# Patient Record
Sex: Female | Born: 1975
Health system: Southern US, Community
[De-identification: ages and names within clinical notes are randomized; demographics above are authoritative.]

## PROBLEM LIST (undated history)

## (undated) DIAGNOSIS — E559 Vitamin D deficiency, unspecified: Secondary | ICD-10-CM

## (undated) DIAGNOSIS — B019 Varicella without complication: Secondary | ICD-10-CM

## (undated) DIAGNOSIS — R011 Cardiac murmur, unspecified: Secondary | ICD-10-CM

## (undated) DIAGNOSIS — E079 Disorder of thyroid, unspecified: Secondary | ICD-10-CM

## (undated) DIAGNOSIS — I10 Essential (primary) hypertension: Secondary | ICD-10-CM

## (undated) DIAGNOSIS — Q249 Congenital malformation of heart, unspecified: Secondary | ICD-10-CM

## (undated) DIAGNOSIS — N941 Unspecified dyspareunia: Secondary | ICD-10-CM

## (undated) HISTORY — DX: Essential (primary) hypertension: I10

## (undated) HISTORY — DX: Cardiac murmur, unspecified: R01.1

## (undated) HISTORY — DX: Vitamin D deficiency, unspecified: E55.9

## (undated) HISTORY — DX: Varicella without complication: B01.9

## (undated) HISTORY — DX: Unspecified dyspareunia: N94.10

## (undated) HISTORY — DX: Disorder of thyroid, unspecified: E07.9

## (undated) HISTORY — DX: Congenital malformation of heart, unspecified: Q24.9

---

## 2014-06-06 HISTORY — PX: POLYPECTOMY: SHX149

## 2014-06-09 ENCOUNTER — Ambulatory Visit: Payer: Self-pay | Admitting: Obstetrics and Gynecology

## 2014-06-09 DIAGNOSIS — I059 Rheumatic mitral valve disease, unspecified: Secondary | ICD-10-CM

## 2014-06-09 LAB — HEMOGLOBIN: HGB: 13.4 g/dL (ref 12.0–16.0)

## 2014-06-14 ENCOUNTER — Ambulatory Visit: Payer: Self-pay | Admitting: Obstetrics and Gynecology

## 2014-11-27 NOTE — Op Note (Signed)
PATIENT NAME:  Judith Oneal, Judith Oneal MR#:  [id number] DATE OF BIRTH:  March 17, 1976  DATE OF PROCEDURE:  06/14/2014  PREOPERATIVE DIAGNOSES: 1.  Abnormal uterine bleeding.  2.  Endometrial polyp.   POSTOPERATIVE DIAGNOSES: 1.  Abnormal uterine bleeding.  2.  Endometrial polyp.   OPERATION:  Hysteroscopy, dilation and curettage, polypectomy with MyoSure device and Mirena intrauterine device insertion.   ANESTHESIA:  General.   SURGEON:  Cornelia Walraven S. Marcelline Mates, MD   ESTIMATED BLOOD LOSS:  Minimal.   OPERATIVE FLUIDS:  1000 mL.   URINE OUTPUT:  100 mL.   FLUID DEFICIT:  10 mL.   COMPLICATIONS:  None.   FINDINGS:  Proliferative endometrium. Endometrial polyp was noted at the fundus.   SPECIMEN:  Endometrial curettings and endometrial polyp.   CONDITION:  Stable.   DESCRIPTION OF PROCEDURE:  The patient was taken to the operating room, where she was placed under general anesthesia without difficulty. She was then prepped and draped in normal sterile fashion and positioned in the dorsal lithotomy position. Next, a straight catheterization was performed with return of 100 mL of clear urine. A sterile speculum was then placed in the patient's vagina, and the anterior lip of the cervix was grasped using a single-tooth tenaculum. The uterus was then sounded to approximately 10 cm and dilated appropriately. The hysteroscope was then advanced into the uterine cavity with the above findings noted. The MyoSure device was then inserted into the hysteroscope, and the MyoSure device was activated to perform the polypectomy. As the polypectomy was performed, the MyoSure device was then removed from the hysteroscope device. The hysteroscope was then removed from the patient's uterine cavity. A sharp curettage was then performed until a gritty texture was noted. The hysteroscope was then advanced again to the patient's uterine cavity. The endometrial polyp was noted to be gone at this time. The hysteroscope was again  removed from the uterine cavity, and the Mirena IUD was then inserted without difficulty. After the Mirena IUD was inserted, the single-tooth tenaculum was removed from the anterior lip of the patient's cervix. Good hemostasis was noted. All instruments were removed from the patient's vagina. The patient was awakened and taken to the PACU in stable condition. The Mirena IUD lot# is [id number] with an expiration date of 11/2016. All sponge and instrument counts were correct x 2 at the end of the procedure.     ____________________________ Chesley Noon. Marcelline Mates, MD asc:nb D: 06/14/2014 21:38:17 ET T: 06/14/2014 22:09:20 ET JOB#: [id number]  cc: Chesley Noon. Marcelline Mates, MD, <Dictator> Augusto Gamble MD ELECTRONICALLY SIGNED 06/28/2014 9:54

## 2014-11-29 LAB — SURGICAL PATHOLOGY

## 2015-03-29 ENCOUNTER — Encounter (INDEPENDENT_AMBULATORY_CARE_PROVIDER_SITE_OTHER): Payer: Self-pay

## 2015-03-29 ENCOUNTER — Other Ambulatory Visit: Payer: Self-pay | Admitting: Primary Care

## 2015-03-29 ENCOUNTER — Encounter: Payer: Self-pay | Admitting: Primary Care

## 2015-03-29 ENCOUNTER — Ambulatory Visit (INDEPENDENT_AMBULATORY_CARE_PROVIDER_SITE_OTHER): Payer: 59 | Admitting: Primary Care

## 2015-03-29 VITALS — BP 116/66 | HR 62 | Temp 97.9°F | Ht 66.0 in | Wt 195.4 lb

## 2015-03-29 DIAGNOSIS — E039 Hypothyroidism, unspecified: Secondary | ICD-10-CM

## 2015-03-29 LAB — TSH: TSH: 1.5 u[IU]/mL (ref 0.35–4.50)

## 2015-03-29 MED ORDER — LEVOTHYROXINE SODIUM 112 MCG PO TABS: 112.0000 ug | ORAL_TABLET | Freq: Every day | ORAL | Status: DC

## 2015-03-29 NOTE — Assessment & Plan Note (Signed)
Diagnosed 7 years ago. Currently managed on levothyroxine 112 mcg. Last TSH was February 2016 per patient. Will check TSH today and provide refills at current dose if stable.

## 2015-03-29 NOTE — Progress Notes (Signed)
Subjective:    Patient ID: Judith Oneal, female    DOB: [birth year], 39 y.o.   MRN: [id number]  HPI  Judith Oneal is a 39 year old female who presents today to establish care and discuss the problems mentioned below. Will obtain old records. Last physical was one year ago.  1) Hypothyroidism: Diagnosed 7 years ago. She is managed on levothyroxine 112 mcg and has been on this dose for one year. Denies hair loss, weight gain, fatigue. Last TSH was in February 2016 and stable per patient.  2) Heart murmur: Diagnosed at age 62. She underwent an echocardiogram at age 13 and followed up frequently. Last check up was 7 years ago after diagnosis of hypothyroidism. She endorses her last echo to have improved with treatment for underactive thyroid. Denies chest pain, SOB.  Review of Systems  Constitutional: Negative for unexpected weight change.  HENT: Negative for rhinorrhea.   Respiratory: Negative for cough and shortness of breath.   Cardiovascular: Negative for chest pain.  Gastrointestinal: Negative for diarrhea and constipation.  Genitourinary: Negative for difficulty urinating.       Has Mirena, light periods  Musculoskeletal: Negative for myalgias and arthralgias.       Has had cortisone injection to left elbow.  Skin: Negative for rash.  Neurological: Negative for dizziness, numbness and headaches.  Psychiatric/Behavioral:       Denies concerns for anxiety or depression.       Past Medical History  Diagnosis Date  . Chicken pox   . Cardiac arrhythmia due to congenital heart disease   . Heart murmur   . Hypertension   . Thyroid disease     Social History   Social History  . Marital Status: Single    Spouse Name: N/A  . Number of Children: N/A  . Years of Education: N/A   Occupational History  . Not on file.   Social History Main Topics  . Smoking status: Never Smoker   . Smokeless tobacco: Not on file  . Alcohol Use: 0.0 oz/week    0 Standard drinks or equivalent per  week     Comment: spcial  . Drug Use: Not on file  . Sexual Activity: Not on file   Other Topics Concern  . Not on file   Social History Narrative   Single.   No children.   Works at [employer] as a Freight forwarder.   [place] lake, reading.    Past Surgical History  Procedure Laterality Date  . Polypectomy      polyp removal from uterus and mirena inserted    Family History  Problem Relation Age of Onset  . Arthritis Mother   . Prostate cancer Father   . Arthritis Maternal Grandfather   . Hyperlipidemia Maternal Grandfather   . Hypertension Maternal Grandfather   . Kidney cancer Maternal Grandfather     No Known Allergies  No current outpatient prescriptions on file prior to visit.   No current facility-administered medications on file prior to visit.    BP 116/66 mmHg  Pulse 62  Temp(Src) 97.9 F (36.6 C) (Oral)  Ht 5' 6"  (1.676 m)  Wt 195 lb 6.4 oz (88.633 kg)  BMI 31.55 kg/m2  SpO2 98%  LMP 03/15/2015    Objective:   Physical Exam  Constitutional: She appears well-nourished.  Cardiovascular: Normal rate and regular rhythm.   Pulmonary/Chest: Effort normal and breath sounds normal.  Musculoskeletal: Normal range of motion. She exhibits no edema  or tenderness.  Skin: Skin is warm and dry.  Psychiatric: She has a normal mood and affect.          Assessment & Plan:

## 2015-03-29 NOTE — Progress Notes (Signed)
Pre visit review using our clinic review tool, if applicable. No additional management support is needed unless otherwise documented below in the visit note. 

## 2015-03-29 NOTE — Patient Instructions (Signed)
Complete lab work prior to leaving today. I will notify you of your results.  Please schedule a physical with me in the next 3 months. You will also schedule a lab only appointment one week prior. We will discuss your lab results during your physical.  It was a pleasure to meet you today! Please don't hesitate to call me with any questions. Welcome to [facility]!

## 2015-04-05 ENCOUNTER — Encounter: Payer: Self-pay | Admitting: Primary Care

## 2015-06-21 ENCOUNTER — Other Ambulatory Visit: Payer: Self-pay | Admitting: Internal Medicine

## 2015-06-21 DIAGNOSIS — Z0000 Encounter for general adult medical examination without abnormal findings: Secondary | ICD-10-CM

## 2015-06-28 ENCOUNTER — Other Ambulatory Visit (INDEPENDENT_AMBULATORY_CARE_PROVIDER_SITE_OTHER): Payer: 59

## 2015-06-28 DIAGNOSIS — Z0000 Encounter for general adult medical examination without abnormal findings: Secondary | ICD-10-CM | POA: Diagnosis not present

## 2015-06-28 LAB — TSH: TSH: 0.87 u[IU]/mL (ref 0.35–4.50)

## 2015-06-28 LAB — CBC
HCT: 42.1 % (ref 36.0–46.0)
HEMOGLOBIN: 13.8 g/dL (ref 12.0–15.0)
MCHC: 32.7 g/dL (ref 30.0–36.0)
MCV: 86.1 fl (ref 78.0–100.0)
PLATELETS: 193 10*3/uL (ref 150.0–400.0)
RBC: 4.89 Mil/uL (ref 3.87–5.11)
RDW: 13.5 % (ref 11.5–15.5)
WBC: 5.1 10*3/uL (ref 4.0–10.5)

## 2015-06-28 LAB — COMPREHENSIVE METABOLIC PANEL
ALT: 15 U/L (ref 0–35)
AST: 16 U/L (ref 0–37)
Albumin: 3.9 g/dL (ref 3.5–5.2)
Alkaline Phosphatase: 72 U/L (ref 39–117)
BUN: 11 mg/dL (ref 6–23)
CALCIUM: 9.2 mg/dL (ref 8.4–10.5)
CHLORIDE: 106 meq/L (ref 96–112)
CO2: 27 meq/L (ref 19–32)
Creatinine, Ser: 0.92 mg/dL (ref 0.40–1.20)
GFR: 71.41 mL/min (ref >60.00)
GLUCOSE: 98 mg/dL (ref 70–99)
POTASSIUM: 4.4 meq/L (ref 3.5–5.1)
Sodium: 139 mEq/L (ref 135–145)
Total Bilirubin: 0.3 mg/dL (ref 0.2–1.2)
Total Protein: 7 g/dL (ref 6.0–8.3)

## 2015-06-28 LAB — LIPID PANEL
CHOLESTEROL: 211 mg/dL — AB (ref 0–200)
HDL: 44.4 mg/dL (ref >39.00)
LDL Cholesterol: 146 mg/dL — ABNORMAL HIGH (ref 0–99)
NonHDL: 166.57
TRIGLYCERIDES: 105 mg/dL (ref 0.0–149.0)
Total CHOL/HDL Ratio: 5
VLDL: 21 mg/dL (ref 0.0–40.0)

## 2015-06-28 LAB — T4, FREE: FREE T4: 1.08 ng/dL (ref 0.60–1.60)

## 2015-07-05 ENCOUNTER — Encounter: Payer: Self-pay | Admitting: Primary Care

## 2015-07-05 ENCOUNTER — Ambulatory Visit (INDEPENDENT_AMBULATORY_CARE_PROVIDER_SITE_OTHER): Payer: 59 | Admitting: Primary Care

## 2015-07-05 VITALS — BP 114/76 | HR 63 | Temp 98.0°F | Ht 66.0 in | Wt 191.4 lb

## 2015-07-05 DIAGNOSIS — E785 Hyperlipidemia, unspecified: Secondary | ICD-10-CM

## 2015-07-05 DIAGNOSIS — E039 Hypothyroidism, unspecified: Secondary | ICD-10-CM

## 2015-07-05 DIAGNOSIS — Z0000 Encounter for general adult medical examination without abnormal findings: Secondary | ICD-10-CM | POA: Diagnosis not present

## 2015-07-05 DIAGNOSIS — Z0001 Encounter for general adult medical examination with abnormal findings: Secondary | ICD-10-CM | POA: Insufficient documentation

## 2015-07-05 NOTE — Assessment & Plan Note (Signed)
TC of 211, LDL of 146. Discussed importance of healthy diet and regular exercise. Will continue to monitor. Recheck in 1 year.

## 2015-07-05 NOTE — Assessment & Plan Note (Signed)
Tdap and pap UTD. Declines flu. She is to schedule mammogram. Exam unremarkable. Labs with hyperlipidemia. Discussed importance of healthy diet and regular exercise. Follow up in 1 year for repeat physical.

## 2015-07-05 NOTE — Patient Instructions (Signed)
Work to increase consumption of vegetables, lean meats, and water. Reduce pastas, breads, rice, sweets.  Start exercising. You should be getting 1 hour of moderate intensity exercise 5 days weekly.  Schedule your mammogram as discussed.  Follow up in 1 year for repeat physical or sooner if needed.  It was a pleasure to see you today!

## 2015-07-05 NOTE — Progress Notes (Signed)
Pre visit review using our clinic review tool, if applicable. No additional management support is needed unless otherwise documented below in the visit note. 

## 2015-07-05 NOTE — Progress Notes (Signed)
Subjective:    Patient ID: Judith Oneal, female    DOB: 03-Nov-1975, 39 y.o.   MRN: [id number]  HPI  Judith Oneal is a 39 year old female who presents today for complete physical.  Immunizations: -Tetanus: Completed in 08/2008 -Influenza: Declines today.   Diet: Judith Oneal endorses a fair diet. Breakfast: Bagel or cereal Lunch: Protein drink, fruit, nuts. Dinner: Dance movement psychotherapist, pasta, vegetables, occasional bread Snacks: None Desserts: Twice weekly Beverages: Water, black coffee Exercise: Judith Oneal does not currently exercise. Eye exam: Completed 1 year ago. Dental exam: Completed 4-5 years ago Pap Smear: Completed in 2015 with Dr. Marcelline Mates. Normal. Mammogram: Completed in 2013.    Review of Systems  Constitutional: Negative for unexpected weight change.  HENT: Negative for rhinorrhea.   Respiratory: Negative for cough and shortness of breath.   Cardiovascular: Negative for chest pain.  Gastrointestinal: Negative for diarrhea and constipation.  Genitourinary: Negative for difficulty urinating.       Mirena in place.   Musculoskeletal: Negative for myalgias and arthralgias.  Skin: Negative for rash.  Neurological: Negative for dizziness, numbness and headaches.       Occasional numbess from tendonitis  Psychiatric/Behavioral:       Denies concerns for anxiety or depression       Past Medical History  Diagnosis Date  . Chicken pox   . Cardiac arrhythmia due to congenital heart disease   . Heart murmur   . Hypertension   . Thyroid disease   . Vitamin D deficiency     Social History   Social History  . Marital Status: Single    Spouse Name: N/A  . Number of Children: N/A  . Years of Education: N/A   Occupational History  . Not on file.   Social History Main Topics  . Smoking status: Never Smoker   . Smokeless tobacco: Not on file  . Alcohol Use: 0.0 oz/week    0 Standard drinks or equivalent per week     Comment: spcial  . Drug Use: Not on file  . Sexual Activity: Not  on file   Other Topics Concern  . Not on file   Social History Narrative   Single.   No children.   Works at [employer] as a Freight forwarder.   Enjoys going to the lake, reading.    Past Surgical History  Procedure Laterality Date  . Polypectomy      polyp removal from uterus and mirena inserted    Family History  Problem Relation Age of Onset  . Arthritis Mother   . Prostate cancer Father   . Arthritis Maternal Grandfather   . Hyperlipidemia Maternal Grandfather   . Hypertension Maternal Grandfather   . Kidney cancer Maternal Grandfather     No Known Allergies  Current Outpatient Prescriptions on File Prior to Visit  Medication Sig Dispense Refill  . Cholecalciferol (VITAMIN D3) 2000 UNITS TABS Take 2 tablets by mouth daily.    Marland Kitchen levothyroxine (SYNTHROID, LEVOTHROID) 112 MCG tablet Take 1 tablet (112 mcg total) by mouth daily before breakfast. 30 tablet 11  . Multiple Vitamin (MULTI VITAMIN DAILY PO) Take 1 tablet by mouth daily.    . vitamin C (ASCORBIC ACID) 500 MG tablet Take 500 mg by mouth daily.    . vitamin E 400 UNIT capsule Take 400 Units by mouth daily.     No current facility-administered medications on file prior to visit.    BP 114/76 mmHg  Pulse 63  Temp(Src) 98 F (  36.7 C) (Oral)  Ht 5' 6"  (1.676 m)  Wt 191 lb 6.4 oz (86.818 kg)  BMI 30.91 kg/m2  SpO2 99%  LMP 06/20/2015    Objective:   Physical Exam  Constitutional: Judith Oneal is oriented to person, place, and time. Judith Oneal appears well-nourished.  HENT:  Right Ear: Tympanic membrane and ear canal normal.  Left Ear: Tympanic membrane and ear canal normal.  Nose: Nose normal.  Mouth/Throat: Oropharynx is clear and moist.  Eyes: Conjunctivae and EOM are normal. Pupils are equal, round, and reactive to light.  Neck: Neck supple. No thyromegaly present.  Cardiovascular: Normal rate and regular rhythm.   Pulmonary/Chest: Effort normal and breath sounds normal.  Abdominal: Soft. Bowel sounds are normal.  There is no tenderness.  Musculoskeletal: Normal range of motion.  Lymphadenopathy:    Judith Oneal has no cervical adenopathy.  Neurological: Judith Oneal is alert and oriented to person, place, and time. Judith Oneal has normal reflexes. No cranial nerve deficit.  Skin: Skin is warm and dry.  Psychiatric: Judith Oneal has a normal mood and affect.          Assessment & Plan:

## 2015-07-05 NOTE — Assessment & Plan Note (Signed)
TSH and Free T4 stable on Levothyroxine 112 mcg. Continue same dose.

## 2016-01-05 ENCOUNTER — Ambulatory Visit (INDEPENDENT_AMBULATORY_CARE_PROVIDER_SITE_OTHER): Payer: Commercial Managed Care - HMO | Admitting: Obstetrics and Gynecology

## 2016-01-05 ENCOUNTER — Encounter: Payer: Self-pay | Admitting: Obstetrics and Gynecology

## 2016-01-05 VITALS — BP 119/74 | HR 51 | Ht 66.0 in | Wt 188.1 lb

## 2016-01-05 DIAGNOSIS — I341 Nonrheumatic mitral (valve) prolapse: Secondary | ICD-10-CM | POA: Diagnosis not present

## 2016-01-05 DIAGNOSIS — E038 Other specified hypothyroidism: Secondary | ICD-10-CM | POA: Diagnosis not present

## 2016-01-05 DIAGNOSIS — E669 Obesity, unspecified: Secondary | ICD-10-CM

## 2016-01-05 DIAGNOSIS — Z01419 Encounter for gynecological examination (general) (routine) without abnormal findings: Secondary | ICD-10-CM | POA: Diagnosis not present

## 2016-01-05 DIAGNOSIS — Z124 Encounter for screening for malignant neoplasm of cervix: Secondary | ICD-10-CM | POA: Diagnosis not present

## 2016-01-05 DIAGNOSIS — E034 Atrophy of thyroid (acquired): Secondary | ICD-10-CM

## 2016-01-05 NOTE — Patient Instructions (Addendum)
RE: MyChart  Dear Ms. Scharrer  We are excited to introduce MyChart, a new best-in-class service that provides you online access to important information in your electronic medical record. We want to make it easier for you to view your health information - all in one secure location - when and where you need it. We expect MyChart will enhance the quality of care and service we provide. Use the activation code below to enroll in MyChart online at [email or web address]  When you register for MyChart, you can:  Marland Kitchen View your test results. . Communicate securely with your physician's office.  . View your medical history, allergies, medications, and immunizations. . Conveniently print information such as your medication lists.  If you are age 42 or older and want a member of your family to have access to your record, you must provide written consent by completing a proxy form available at our facility. Please speak to our clinical staff about guidelines regarding accounts for patients younger than age 42.  As you activate your MyChart account and need any technical assistance, please call the MyChart technical support line at [phone]) or email your question to [email or web address]. If you email your question(s), please include your name, a return phone number and the best time to reach you.  Thank you for using MyChart as your new health and wellness resource!  MyChart Activation Code:  Activation code not generated Current MyChart Status: Active           [facility]  [place], [place] [address] Cuidados preventivos en las mujeres adultas (Preventive Care for Adults, Female) Un estilo de vida saludable y los cuidados preventivos pueden favorecer la salud y [place]. Las pautas de salud preventivas para las mujeres incluyen las siguientes prcticas clave:  Un examen fsico de rutina anual y Optometrist estudios preventivos es un buen  modo de Chief Technology Officer su salud. Retsof de Publishing rights manager preocupaciones y Civil engineer, contracting el estado de su salud, y que le realicen estudios completos.  Consulte al dentista para realizar un examen de rutina y cuidados preventivos cada 6 meses. Cepllese los dientes al [employer] veces por da y psese el hilo dental al menos una vez por da. Una buena higiene bucal evita caries y enfermedades de las encas.  La frecuencia con que debe hacerse exmenes de la vista depende de su edad, su estado de [place], su historia familiar, el uso de lentes de contacto y otros factores. Siga las recomendaciones del mdico para saber con qu frecuencia debe hacerse exmenes de la vista.  Consuma una dieta saludable. Los alimentos que contienen vegetales, las frutas, los cereales [place], los productos lcteos bajos en grasas y las protenas magras contienen nutrientes que son necesarios, sin consumir Nurse, mental health. Disminuya la ingesta de alimentos ricos en grasas slidas, azcares y sal agregadas. Consuma la cantidad de caloras adecuada para usted. Si es necesario, pdale informacin acerca de una dieta [place] a su mdico.  Realizar actividad fsica de forma regular es una de las prcticas ms importantes que puede hacer por su salud. Los adultos deben hacer al menos 150 minutos de ejercicios de intensidad moderada (cualquier actividad que aumente la frecuencia cardaca y lo haga transpirar) cada semana. Adems, la [employer] de los adultos necesita practicar ejercicios de fortalecimiento muscular dos o ms veces por semana.  Mantenga un peso saludable. El ndice de masa corporal [facility]) es una herramienta que identifica posibles problemas con [place]. Proporciona una estimacin de  la grasa corporal basndose en el peso y la altura. El mdico podr determinar su [facility] y ayudarlo a Scientist, forensic o Theatre manager un peso saludable. Para los adultos de 20 aos o ms:  Un [facility] menor de 18,5 se considera bajo peso.  Un [facility] entre 18,5 y  24,9 es normal.  Un [facility] entre 25 y 29,9 se considera sobrepeso.  Un IMC de 30 o ms se considera obesidad.  Realice actividad fsica y evite ingerir grasas saturadas para mantener un nivel normal de lpidos y Research scientist (life sciences). Consuma una dieta equilibrada y saludable, e incluya variedad de frutas y Photographer. A partir de los 42 aos se deben realizar anlisis de sangre a fin de Freight forwarder nivel de lpidos y colesterol en la sangre y [place] cada 5 aos. Si los niveles de lpidos o colesterol son altos, tiene ms de 50 aos o tiene riesgo elevado de sufrir enfermedades cardacas, Designer, industrial/product controlarse con ms frecuencia. Si tiene Coca Cola de lpidos y colesterol, debe recibir tratamiento con medicamentos, si la dieta y el ejercicio no estn funcionando.  Si fuma, consulte con el mdico acerca de las opciones para dejar de [place]. Si no consume tabaco, no comience.  Se recomienda realizar exmenes de deteccin de cncer de pulmn a personas adultas entre 42 y 42 aos que estn en riesgo de Horticulturist, commercial de pulmn por sus antecedentes de consumo de tabaco. Para quienes hayan fumado durante 30 aos un paquete diario, y sigan fumando o hayan dejado el hbito en algn momento en los ltimos 15 aos, se recomienda realizarse una tomografa computarizada de baja dosis de los pulmones todos los aos. Fumar 1paquete-ao equivale a fumar un promedio de 1paquete de cigarrillos diario durante 1ao (por ejemplo: 1paquete por da durante 30 aos o 2paquetes por da durante 15aos). Los exmenes anuales deben continuar hasta que el fumador haya dejado de fumar durante un mnimo de 15 aos. Ya no deben Emergency planning/management officer que tengan un problema de salud que les impida recibir tratamiento para el cncer de pulmn.  Si est embarazada, no beba alcohol. Si est amamantando, beba alcohol con prudencia. Si no est embarazada y decide beber alcohol, no beba ms de Naval architect. Se  considera una medida a 12onzas (337m) de cerveza, 5onzas (1434m de vino, o 1,3,[id number] licor.  Evite el consumo de drogas. No comparta las agujas. Pida ayuda si necesita asistencia o instrucciones con respecto a abandonar el consumo de drogas.  La hipertensin arterial causa enfermedades cardacas y auSerbial riesgo de ictus. Debe controlar su presin arterial al menos cada uno o doMorro BayLa hipertensin arterial que persiste debe tratarse con medicamentos si la prdida de peso y el ejercicio no son efectivos.  Si tiene entre [phone], consulte a su mdico si debe tomar aspirina para prevenir ictus.  Los anC.H. Robinson Worldwidee deteccin de la diabetes se realizan extrayendo una muLuis Lopeze saLattimoreara verificar el nivel sanguneo de glucosa despus de no haber comido durante determinado perodo (ay[place]an[place] Si usted no tiene sobrepeso ni factores de riesgo de diabetes, deben hacerle estos anlisis una vez cada 3 aos a partir de los 45102os. Si usted tiene sobrepeso u obesidad y su edad es de 40 a 7032os, deben hacerle anlisis de deProgramme researcher, broadcasting/film/videoe la diabetes todos los aos como parte de la evaluacin del riesgo cardiovascular.  Las evaluaciones para dePublic affairs consultante mama son un mtodo preventivo fundamental para las mujeres. Debe practicar  la "autoconciencia de las mamas". Esto significa que Chief Technology Officer apariencia normal de sus mamas y cmo se sienten, y Electrical engineer un autoexamen de Glass blower/designer. Si detecta algn cambio, no importa cun pequeo sea, debe informarlo a su mdico. Las ConAgra Foods 20 y 6 aos deben hacerse un examen clnico de las mamas como parte del examen regular de [place], cada uno a tres aos. Despus de los [address] deben Coca[employer]a. A partir de los [address], deben hacerse una mamografa (radiografa de mamas) cada ao. Las mujeres con antecedentes familiares de cncer de mama deben hablar con el mdico para someterse a un estudio gentico. Las que tienen ms riesgo  deben hacerse una resonancia magntica y Lavinia Sharps todos [place].  La evaluacin del riesgo de cncer relacionado con el gen del cncer de mama (BRCA) se recomienda a mujeres que tengan familiares con cncer relacionado con el BRCA. Los cnceres relacionados con el BRCA incluyen el cncer de mama, de ovario, de trompas y peritoneal. Raynelle Jan familiares con estos tumores malignos puede estar asociado con un mayor riesgo de cambios dainos (mutaciones) en los genes del cncer de mama BRCA1 y BRCA2. Los resultados de la evaluacin determinarn la necesidad de asesoramiento gentico y de [place] de BRCA1 y BRCA2.  El mdico puede recomendarle que se haga pruebas peridicas de deteccin de cncer de los rganos de la pelvis (ovarios, tero y vagina). Estas pruebas incluyen un examen plvico, que abarca controlar si se produjeron cambios microscpicos en la superficie del cuello del tero (prueba de Papanicolaou). Pueden recomendarle que se haga estas pruebas cada 3aos, a partir de los 21aos.  A las mujeres que tienen entre 30 y 86aos, los mdicos pueden recomendarles que se sometan a exmenes plvicos y pruebas de Papanicolaou cada 65aos, o a la prueba de Papanicolaou y el examen plvico en combinacin con estudios de deteccin del virus del papiloma humano (VPH) cada 5aos. Algunos tipos de VPH aumentan el riesgo de Chief Financial Officer de cuello del tero. La prueba para la deteccin del VPH tambin puede realizarse a mujeres de cualquier edad cuyos resultados de la prueba de Papanicolaou no sean claros.  Es posible que otros mdicos no recomienden exmenes de deteccin a mujeres no embarazadas que se consideran sujetos de bajo riesgo de Chief Financial Officer de pelvis y que no tienen sntomas. Pregntele al mdico si un examen plvico de deteccin es adecuado para usted.  Si ha recibido un tratamiento para Science writer cervical o una enfermedad que podra causar cncer, necesitar realizarse una prueba de  Papanicolaou y controles durante al menos 95 aos de concluido el [place]. Si no se ha hecho el Papanicolaou con regularidad, debern volver a evaluarse los factores de riesgo (como tener un nuevo compaero sexual), para Teacher, adult education si debe realizarse los estudios nuevamente. Algunas mujeres sufren problemas mdicos que aumentan la probabilidad de Museum/gallery curator cncer de cuello del tero. En estos casos, el mdico podr [employer] se realicen controles y pruebas de Papanicolaou con ms frecuencia.  El cncer colorrectal puede detectarse y con frecuencia puede prevenirse. La mayor parte de los estudios de rutina se deben Medical laboratory scientific officer a Field seismologist a Proofreader de los 72 aos y [place] 50 aos. Sin embargo, el mdico podr aconsejarle que lo haga antes, si tiene factores de riesgo para el cncer de colon. Una vez por ao, el mdico le dar un kit de prueba para Hydrologist en la materia fecal. La utilizacin de un tubo con John Giovanni cmara  en su extremo para examinar directamente el colon (sigmoidoscopia o colonoscopia), puede detectar formas tempranas de cncer colorrectal. Hable con su mdico si tiene 36 aos, edad en la que debe comenzar a Optometrist los estudios de [place]. El examen directo del colon debe repetirse cada 5 a 10aos, hasta los 91aos, excepto que se encuentren formas tempranas de plipos precancerosos o pequeos bultos.  Las personas con un riesgo mayor de Insurance risk surveyor hepatitis B deben realizarse anlisis para Futures trader virus. Se considera que tiene un alto riesgo de Museum/gallery curator hepatitis B si:  Naci en un pas donde la hepatitis B es frecuente. Pregntele a su mdico qu pases son considerados de Public affairs consultant.  Sus padres nacieron en un pas de alto riesgo y usted no recibi una vacuna que lo proteja contra la hepatitisB (vacuna contra la hepatitisB).  [place].  [place] agujas para inyectarse drogas.  Vive o tiene sexo con alguien que tiene hepatitis B.  Recibe tratamiento de  hemodilisis.  Toma ciertos medicamentos para Nurse, mental health, trasplante de rganos y afecciones autoinmunes.  Se recomienda realizar un anlisis de sangre para Hydrographic surveyor hepatitis C a todas las personas nacidas entre 1945 y 1965, y a todo aquel que sepa que tiene riesgo de haber contrado esta enfermedad.  Practique el sexo seguro. Use condones y evite las prcticas sexuales riesgosas para disminuir el contagio de enfermedades de transmisin sexual (ETS). Algunas ETS son la gonorrea, clamidia, sfilis, tricomoniasis, herpes, VPH y el virus de inmunodeficiencia humana (VIH). El herpes, el VIH y [place] VPH son enfermedades virales que no tienen [place]. Pueden producir discapacidad, cncer y [employer].  Debe realizarse pruebas de deteccin de enfermedades de transmisin sexual (ETS), incluidas gonorrea y clamidia si:  Es sexualmente activa y es menor de 24aos.  Es mayor de 24aos, y Investment banker, operational informa que corre riesgo de tener este tipo de [place].  La actividad sexual ha cambiado desde que le hicieron la ltima prueba de deteccin y tiene un riesgo mayor de Best boy clamidia o Radio broadcast assistant. Pregntele al mdico si usted tiene riesgo.  Si tiene riesgo de infectarse por el VIH, se recomienda tomar diariamente un medicamento recetado para evitar la infeccin. Esto se conoce como profilaxis previa a la exposicin. Se considera que est en riesgo si:  Es [place] sexualmente y no [place] preservativos habitualmente o no conoce el estado del VIH de sus Advertising copywriter.  Se inyecta drogas.  Es [place] sexualmente con Ardelia Mems pareja que tiene VIH.  Consulte a su mdico para saber si tiene un alto riesgo de infectarse por el VIH. Si opta por comenzar la profilaxis previa a la exposicin, primero debe realizarse anlisis de deteccin del VIH. Luego, le harn anlisis cada 40mses mientras est tomando los medicamentos para la profilaxis previa a la exposicin.  La osteoporosis es una enfermedad en  la que los huesos pierden los minerales y la fuerza por el avance de la edad. El resultado pueden ser fracturas o quebraduras graves en [place] El riesgo de osteoporosis puede identificarse con uArdelia Memsprueba de densidad sea. Las mujeres de ms de 619aos y las que tengan riesgos de sufrir fracturas u osteoporosis deben discutir las opciones de control con su mdico. Consulte a su mdico si debe tomar un suplemento de calcio o de vitamina D para reducir el riesgo de osteoporosis.  La menopausia est asociada a sntomas y riesgos fsicos. Se dispone de una terapia de reemplazo hormonal para disminuir los sntomas y [place]  Consulte a su mdico para saber si la terapia de reemplazo hormonal es conveniente para usted.  Utilice pantalla solar. Aplique pantalla solar de Kerry Dory y repetida a lo largo del Training and development officer. Resgurdese del sol cuando la sombra sea ms pequea que usted. Protjase usando mangas y pantalones largos, un sombrero de ala ancha y anteojos para el sol todo el ao, siempre que se encuentre al [place].  Una vez por mes hgase un examen de la piel de todo el cuerpo usando un espejo para ver la espalda. Informe al mdico si aparecen nuevos lunares, o si nota que los que ya tena ahora tienen bordes [place], aumentaron su tamao y son ms grandes que una goma de lpiz o si su forma o color cambi.  Mantngase al da con las vacunas obligatorias.  Vacuna antigripal. Todas las personas adultas deben vacunarse cada ao.  Vacuna contra la difteria, el ttanos y Research officer, trade union (Td, Tdap). Las mujeres embarazadas deben recibir una dosis de la vacuna Tdap en cada embarazo. Se debe recibir la dosis independientemente de cunto tiempo haya transcurrido desde la ltima dosis. Es preferible vacunarse entre la semana 58 y 7 de la gestacin. Una persona adulta que no haya recibido la vacuna Tdap anteriormente o que no sabe su estado de vacunacin debe recibir una dosis. Despus de esta  dosis inicial, necesitar aplicarse un refuerzo de la vacuna contra la difteria y el ttanos (Td) cada 10 aos. Las Ship broker que no sepan o no hayan recibido la serie de vacunacin de tres dosis contra la difteria y el ttanos deben iniciar o finalizar una serie de vacunacin primaria, que incluye la dosis contra la difteria, el ttanos y Research officer, trade union (Tdap). Las personas adultas deben recibir una dosis de refuerzo de Td cada 10 aos.  Vacuna contra la varicela. Ardelia Mems persona adulta sin prueba de inmunidad a la varicela debe recibir dos dosis o una segunda dosis si recibi una dosis previamente. Las embarazadas sin prueba de inmunidad deben recibir la primera dosis despus del Media planner. Esta primera dosis se debe aplicar antes del alta del centro de salud. La segunda dosis debe aplicarse entre 4 y 8 semanas posteriores a la primera dosis.  Vacuna contra el virus del Engineer, technical sales (VPH). Las ConAgra Foods 13 y 36 aos que no hayan recibido la vacuna antes deben recibir la serie de 3 dosis. La vacuna no se recomienda en mujeres embarazadas. Sin embargo, no es Chartered loss adjuster una prueba de [place] antes de recibir una dosis. Si se descubre que una mujer est embarazada despus de recibir la dosis, no se Producer, television/film/video. En ese caso, las dosis restantes deben retrasarse hasta despus del embarazo. Se recomienda la vacuna para cualquier persona inmunodeprimida hasta la edad de 26 aos si no recibi [place] o ninguna de las dosis anteriormente. Durante la serie de 3 dosis, la segunda dosis debe Enterprise Products 4 y 8 semanas posteriores a la primera dosis. La tercera dosis debe aplicarse 24 semanas despus de la primera dosis y 16 semanas despus de la segunda dosis.  Vacuna contra el herpes zoster. Se recomienda una dosis en personas [employer] de 75 aos a menos que sufran ciertas enfermedades.  Vacuna contra el sarampin, la rubola y las paperas ([place]). Los adultos nacidos antes  de 1957 generalmente se consideran inmunes al sarampin y las paperas. Las Forensic scientist en [phone] o posteriormente deben recibir una o ms dosis de la vacuna SRP, a menos que [employer] Mutual [employer] Omaha contraindicacin  para la vacuna o que tengan prueba de inmunidad a las tres [place]. Se debe aplicar una segunda dosis de rutina de la vacuna SRP al menos 28das despus de la primera dosis a estudiantes de escuelas terciarias, trabajadores de la salud o viajeros internacionales. Las personas que recibieron la vacuna inactivada contra el sarampin o algn tipo desconocido de vacuna contra el sarampin [place] y 1967 deben recibir dos dosis de la vacuna [place]. Las Advertising copywriter recibieron la vacuna inactivada contra las paperas o algn tipo desconocido de vacuna contra las paperas antes de 1979 y tienen un alto riesgo de infectarse con la enfermedad deben considerar vacunarse con dos dosis de la vacuna SRP. En las mujeres en edad frtil, debe determinarse la inmunidad contra la rubola. Si no hay prueba de inmunidad, las mujeres que no estn embarazadas deben vacunarse. Si no hay prueba de inmunidad, las mujeres que estn embarazadas deben retrasar la vacunacin hasta despus del [place]. Los trabajadores de [employer]	[place] no vacunados que nacieron antes de 1957 y que no tienen prueba de inmunidad contra el sarampin, la rubola y las paperas o no tienen confirmacin de laboratorio de la enfermedad deben considerar vacunarse contra el sarampin y las paperas con dos dosis de la vacuna [place], y contra la rubola con una dosis de la vacuna SRP.  Vacuna antineumoccica conjugada 13 valente (PCV13). Segn indicacin mdica, una persona que no conozca su historia de vacunacin y no tenga registro de vacunas debe recibir la vacuna PCV13. Todos los adultos de 65 aos o ms deben recibir esta vacuna. Una persona de 19 aos o ms que tenga ciertas enfermedades y no se haya vacunado debe recibir una dosis de la vacuna PCV13. Despus  de esta vacuna, se debe aplicar una dosis de la vacuna antineumoccica de polisacridos (PPSV23). Los adultos con alto riesgo de enfermedad neumoccica deben recibir la vacuna PPSV23 al menos 8 semanas despus de la dosis de la vacuna PCV13. Los adultos de ms de 65 aos cuyo sistema inmunitario funcione normalmente deben recibir la dosis de la vacuna PPSV23 al menos 1 ao despus de la dosis de la vacuna PCV13.  Vacuna antineumoccica de polisacridos (PPSV23). Si se indica la vacuna PCV13, primero debe recibir la vacuna PCV13. Todas las personas de 65 aos o mayores deben recibir la vacuna. Una Network engineer de 58 aos que tenga ciertas enfermedades se Teacher, English as a foreign language. Cleora Fleet persona que viva en un hogar de Mining engineer o en un centro de atencin durante mucho tiempo se debe vacunar. Un fumador adulto se Teacher, English as a foreign language. Las personas inmunodeprimidas o con otras enfermedades deben recibir ambas vacunas, PCV13 y PPSV23. Las personas infectadas con el virus de la inmunodeficiencia humana (VIH) deben recibir la vacuna lo antes posible despus del diagnstico. Se debe evitar la vacunacin durante tratamientos de quimioterapia y radioterapia. El uso de rutina de la vacuna PPSV23 no est recomendado para Teacher, early years/pre, personas nativas de [place] o [employer] de 65aos, salvo que tengan ciertas enfermedades que requieran la vacuna. Segn indicacin mdica, las personas que no conozcan su historia de vacunacin y no tengan registros de [place], deben recibir la vacuna PPSV23. Se recomienda una nica revacunacin 5 aos despus de recibir la primera dosis de PPSV23 para personas de 19 a 25 aos con insuficiencia renal crnica, sndrome nefrtico, asplenia o inmunodepresin. Las [place] [employer] recibieron de una a dos dosis de PPSV23 antes de los 65 aos deben recibir otra dosis de [place] a los 83 aos de edad o  posteriormente si pasaron cinco aos, como mnimo, desde la dosis anterior. Las dosis de PPSV23 no son necesarias para  personas que ya recibieron la vacuna a los 80 aos o posteriormente.  Vacuna antimeningoccica. Los adultos con asplenia o con persistentes deficiencias de componentes terminales del complemento deben recibir dos dosis de la vacuna antimeningoccica conjugada tetravalente (MenACWY-D). Las dosis se deben Midwife con un mnimo de 2 meses de diferencia. Deben vacunarse los microbilogos que trabajan con ciertas bacterias meningoccicas, reclutas militares y personas que viajan o viven en pases con una alta tasa de meningitis. Los estudiantes universitarios de Tourist information centre manager la edad de 21 que vivan en una residencia estudiantil deben recibir una dosis si no se aplicaron la vacuna cuando cumplieron o despus de cumplir 16 aos. Las personas que sufren ciertas enfermedades de alto riesgo deben aplicarse una o ms dosis.  Vacuna contra la hepatitis A. Las Advertising copywriter deseen estar protegidas contra esta enfermedad, que sufren ciertas enfermedades de alto riesgo, que trabajan con animales infectados con hepatitis A, que trabajan en los laboratorios de investigacin de hepatitis A, o que viajan o trabajan en pases con una alta tasa de hepatitis A deben recibir la vacuna. Los personas que no fueron vacunadas previamente y [place]o [place] a tener un contacto cercano con una persona adoptada fuera del pas, deben recibir la vacuna durante los primeros [address] despus de su llegada a los [place] desde un pas con una alta tasa de hepatitis A.  Vacuna contra la hepatitis B. Las [place] [employer] deseen estar protegidas contra esta enfermedad, que sufren ciertas enfermedades de alto riesgo, que puedan estar expuestas a sangre u otros fluidos corporales infecciosos, que tienen contactos familiares o parejas sexuales con hepatitis B positivo, que sean clientes o trabajadores de ciertos centros de atencin, o que viajan o trabajan en pases con una alta tasa de hepatitis B deben recibir la vacuna.  Vacuna antihaemophilus  influenzae tipoB ([place]). Una persona no vacunada previamente, que sufra de asplenia o de anemia drepanoctica, o que tenga una esplenectoma programada debe recibir una dosis de la vacuna [place]. Independientemente de la vacunacin previa, un paciente trasplantado con clulas madre hematopoyticas debe recibir Ardelia Mems serie de tres dosis, de 6 a 12 meses despus del trasplante exitoso. La vacuna Hib no est recomendada para personas adultas infectadas con VIH. Controles preventivos - Frecuencia Entre 64 y 52aos  Control de la presin arterial.**/Cada 3 a 5 aos.  Control de lpidos y colesterol.**Carma Lair cinco aos a partir de los 20 aos.  Examen clnico de [employer].**Carma Lair 3 aos en las [employer] 20 y los 28 aos.  Evaluacin del riesgo de cncer relacionado con el BRCA.**/Para mujeres que tienen familiares con cncer relacionado con el BRCA (cncer de mama, de ovario, de trompas y peritoneal).  Prueba de Papanicolaou.**Carma Lair dos [employer] 21 y los 32 aos. Cada tres aos a Proofreader de los 4 aos y [place] 66 o 47 aos, con una historia de tres pruebas de Papanicolaou normales consecutivas.  Pruebas de deteccin de VPH.**/Cada tres aos, a partir de los 43 aos y [place] 55 o 59 aos, con una historia de tres pruebas de Papanicolaou normales consecutivas.  Anlisis de sangre para la hepatitis C.**/Para toda persona con riesgos conocidos de hepatitis C.  Autoexamen de piel [employer].  Vacuna antigripal. [place] los aos.  Vacuna contra la difteria, el ttanos y Research officer, trade union (Tdap, Td).**/Consulte a su mdico. Las mujeres embarazadas deben recibir  una dosis de la vacuna Tdap en cada embarazo. 1dosis de Td cada 10aos.  Vacuna contra la varicela.**/Consulte a su mdico. Las embarazadas sin prueba de inmunidad deben recibir la primera dosis despus del Media planner.  Vacuna contra el VPH. /3 dosis en el curso de 6 meses, si tiene 50 aos o menos. La vacuna no se  recomienda en mujeres embarazadas. Sin embargo, no es Chartered loss adjuster una prueba de [place] antes de recibir una dosis.  Vacuna contra el sarampin, la rubola y las paperas ([place]).Marland KitchenEarleen Newport aplicarse al menos una dosis de SRP si ha nacido en 1957 o despus. Podra tambin necesitar una segunda dosis. En las mujeres en edad frtil, debe determinarse la inmunidad contra la rubola. Si no hay prueba de inmunidad, las mujeres que no estn embarazadas deben vacunarse. Si no hay prueba de inmunidad, las mujeres que estn embarazadas deben retrasar la vacunacin hasta despus del [place].  Vacuna antineumoccica conjugada 13 valente (PCV13).Marland KitchenCecille Amsterdam a su mdico.  Vacuna antineumoccica de polisacridos (PPSV23).**/De una a dos dosis si es fumador o si sufre Actuary.  Vacuna antimeningoccica.**/Si tiene entre 9 y 50 aos y es estudiante universitario de Editor, commissioning que vive en una residencia estudiantil o tiene alguna enfermedad grave, debe recibir Ardelia Mems dosis de esta vacuna. Podra tambin necesitar dosis de refuerzo.  Vacuna contra la hepatitis A.**/Consulte a su mdico.  Vacuna contra la hepatitis B.**/Consulte a su mdico.  Vacuna antihaemophilus influenzae tipoB (Hib).**/Consulte a su mdico. Entre 40 y 39aos  Control de la presin Runner, broadcasting/film/video.  Control de lpidos y colesterol.**Carma Lair cinco aos a partir de los 20 aos.  Pruebas de deteccin de cncer de pulmn. /Todos los aos si tiene entre 59 y 80aos, y si ha fumado durante 30aos un paquete diario y sigue fumando o dej el hbito en algn momento en los ltimos 15aos. Los estudios de Pharmacologist se interrumpen cuando haya dejado de fumar durante al menos 15aos o si tiene un problema de salud que le impida recibir tratamiento para Science writer de pulmn.  Examen clnico de [employer].**/Todos los aos despus de los 40 aos.  Evaluacin del riesgo de cncer relacionado con el BRCA.**/Para mujeres  que tienen familiares con cncer relacionado con el BRCA (cncer de mama, de ovario, de trompas y peritoneal).  Mamografa.**/Una vez por ao a partir de los 62 aos, siempre que tenga buena salud. Consulte a su mdico.  Prueba de Papanicolaou.Marland KitchenCarma Lair tres aos despus de los 65 aos y [place] 21 o 34 aos, con una historia de tres pruebas de Papanicolaou normales consecutivas.  Pruebas de deteccin de VPH.**/Cada tres aos, a partir de los 62 aos y [place] 24 o 24 aos, con una historia de tres pruebas de Papanicolaou normales consecutivas.  Prueba de Personnel officer en las heces [facility]). Carma Lair ao a partir de los 41aos y [employer] 58aos. No tendr que hacerlo si se realiza una colonoscopia cada 10 aos.  Colonoscopia o sigmoidoscopia flexible.Marland KitchenCarma Lair 5 aos para la sigmoidoscopia flexible o cada 10 aos para la colonoscopia, a Proofreader de los 75 aos de edad y [place] 21 aos.  Anlisis de sangre para la hepatitis C.**/Para todas las personas nacidas entre 1945 y 1965, y a todo aquel que tenga un riesgo conocido de haber contrado esta enfermedad.  Autoexamen de piel [employer].  Vacuna antigripal. [place] los aos.  Vacuna contra la difteria, el ttanos y Research officer, trade union (Tdap, Td).**/Consulte a su mdico. Las mujeres embarazadas deben recibir Ardelia Mems  dosis de la vacuna Tdap en cada embarazo. 1dosis de Td cada 10aos.  Vacuna contra la varicela.**/Consulte a su mdico. Las embarazadas sin prueba de inmunidad deben recibir la primera dosis despus del Media planner.  Vacuna contra el herpes zoster.Marland KitchenArdelia Mems dosis para adultos de 60 aos o ms.  Vacuna contra el sarampin, la rubola y las paperas ([place]).Marland KitchenEarleen Newport aplicarse al menos una dosis de SRP si ha nacido en 1957 o despus. Podra tambin necesitar una segunda dosis. En las mujeres en edad frtil, debe determinarse la inmunidad contra la rubola. Si no hay prueba de inmunidad, las mujeres que no estn embarazadas deben vacunarse.  Si no hay prueba de inmunidad, las mujeres que estn embarazadas deben retrasar la vacunacin hasta despus del [place].  Vacuna antineumoccica conjugada 13 valente (PCV13).Marland KitchenCecille Amsterdam a su mdico.  Vacuna antineumoccica de polisacridos (PPSV23).**/De una a dos dosis si es fumador o si sufre Actuary.  Vacuna antimeningoccica.Marland KitchenCecille Amsterdam a su mdico.  Investment banker, operational contra la hepatitis A.**/Consulte a su mdico.  Vacuna contra la hepatitis B.**/Consulte a su mdico.  Vacuna antihaemophilus influenzae tipoB (Hib).**/Consulte a su mdico. Ms de 48 aos  Control de la presin Runner, broadcasting/film/video.  Control de lpidos y colesterol.**Carma Lair cinco aos a partir de los 20 aos.  Pruebas de deteccin de cncer de pulmn. /Todos los aos si tiene entre 57 y 80aos, y si ha fumado durante 30aos un paquete diario y sigue fumando o dej el hbito en algn momento en los ltimos 15aos. Los estudios de Pharmacologist se interrumpen cuando haya dejado de fumar durante al menos 15aos o si tiene un problema de salud que le impida recibir tratamiento para Science writer de pulmn.  Examen clnico de [employer].**/Todos los aos despus de los 40 aos.  Evaluacin del riesgo de cncer relacionado con el BRCA.**/Para mujeres que tienen familiares con cncer relacionado con el BRCA (cncer de mama, de ovario, de trompas y peritoneal).  Mamografa.**/Una vez por ao a partir de los 69 aos, siempre que tenga buena salud. Consulte a su mdico.  Prueba de Papanicolaou.**Carma Lair tres aos despus de los 27 aos y [place] 65 o 29 aos, con tres pruebas de Papanicolaou normales consecutivas. Las pruebas pueden interrumpirse [employer] 29 y los 59 aos, si tiene tres pruebas de Papanicolaou normales consecutivas y ninguna prueba de Papanicolaou ni de VPH anormal en los ltimos 10 aos.  Pruebas de deteccin de VPH.**/Cada tres aos, a partir de los 39 aos y [place] 7 o 90 aos, con una  historia de tres pruebas de Papanicolaou normales consecutivas. Las pruebas pueden interrumpirse [employer] 68 y los 59 aos, si tiene tres pruebas de Papanicolaou normales consecutivas y ninguna prueba de Papanicolaou ni de VPH anormal en los ltimos 10 aos.  Prueba de Personnel officer en las heces [facility]). Carma Lair ao a partir de los 89aos y [facility] 2aos. No tendr que hacerlo si se realiza una colonoscopia cada 10 aos.  Colonoscopia o sigmoidoscopia flexible.Marland KitchenCarma Lair 5 aos para la sigmoidoscopia flexible o cada 10 aos para la colonoscopia, a Proofreader de los 62 aos de edad y [employer] 29 aos.  Anlisis de sangre para la hepatitis C.**/Para todas las personas nacidas entre 1945 y 1965, y a todo aquel que tenga un riesgo conocido de haber contrado esta enfermedad.  Pruebas de deteccin de osteoporosis.Marland KitchenWesley Blas nica vez en mujeres de ms de 78 aos que tengan riesgo de fracturas u osteoporosis.  Autoexamen de piel [employer].  Vacuna antigripal. [place] los aos.  Vacuna contra la difteria, el ttanos y Research officer, trade union (Tdap/Td).**/Una dosis de Td cada 10 aos.  Vacuna contra la varicela.**Cecille Amsterdam a su mdico.  Vacuna contra el herpes zoster.Marland KitchenArdelia Mems dosis para adultos de 60 aos o ms.  Vacuna antineumoccica conjugada 13 valente (PCV13).Marland KitchenCecille Amsterdam a su mdico.  Vacuna antineumoccica de polisacridos (PPSV23).Marland KitchenArdelia Mems dosis para todos los adultos de 65 aos o ms.  Vacuna antimeningoccica.Marland KitchenCecille Amsterdam a su mdico.  Investment banker, operational contra la hepatitis A.**/Consulte a su mdico.  Vacuna contra la hepatitis B.**/Consulte a su mdico.  Vacuna antihaemophilus influenzae tipoB (Hib).**/Consulte a su mdico. ** Los antecedentes familiares y personales de [place] y enfermedades pueden [place]ar[place] manager las recomendaciones del mdico.   Esta informacin no tiene Marine scientist el consejo del mdico. Asegrese de hacerle al mdico cualquier pregunta que tenga.   Document Released: 05/02/2005  Document Revised: 08/13/2014 Elsevier Interactive Patient Education [employer].

## 2016-01-05 NOTE — Progress Notes (Signed)
GYNECOLOGY ANNUAL PHYSICAL EXAM PROGRESS NOTE  Subjective:    Judith Oneal is a 40 y.o.  female who presents for an annual exam. The patient has no complaints today. The patient is sexually active (with long time female partner). The patient wears seatbelts: yes. The patient participates in regular exercise: yes. Has the patient ever been transfused or tattooed?: no. The patient reports that there is not domestic violence in her life.    Gynecologic History Patient's last menstrual period was 01/03/2016. Menarche age: 22 Contraception: IUD  (Mirena, inserted 06/2014) History of STI's: Denies Last Pap: 03/2012. Results were: normal.  Denies h/o abnormal pap smears.    Obstetric History   G0   P0   T0   P0   A0   TAB0   SAB0   E0   M0   L0       Past Medical History  Diagnosis Date  . Chicken pox   . Cardiac arrhythmia due to congenital heart disease   . Heart murmur     Mitral valve prolapse  . Hypertension   . Thyroid disease   . Vitamin D deficiency   . Dyspareunia in female     Past Surgical History  Procedure Laterality Date  . Polypectomy  06/2014    polyp removal from uterus and mirena inserted    Family History  Problem Relation Age of Onset  . Arthritis Mother   . Prostate cancer Father   . Arthritis Maternal Grandfather   . Hyperlipidemia Maternal Grandfather   . Hypertension Maternal Grandfather   . Kidney cancer Maternal Grandfather     Social History   Social History  . Marital Status: Single    Spouse Name: N/A  . Number of Children: N/A  . Years of Education: N/A   Occupational History  . Not on file.   Social History Main Topics  . Smoking status: Never Smoker   . Smokeless tobacco: Not on file  . Alcohol Use: 0.0 oz/week    0 Standard drinks or equivalent per week     Comment: Occasional use, socially   . Drug Use: No  . Sexual Activity: Yes    Birth Control/ Protection: IUD   Other Topics Concern  . Not on file   Social  History Narrative   Single.   No children.   Works at [employer] as a Freight forwarder.   Enjo[place] going to the lake, reading.    Current Outpatient Prescriptions on File Prior to Visit  Medication Sig Dispense Refill  . Cholecalciferol (VITAMIN D3) 2000 UNITS TABS Take 2 tablets by mouth daily.    Marland Kitchen levothyroxine (SYNTHROID, LEVOTHROID) 112 MCG tablet Take 1 tablet (112 mcg total) by mouth daily before breakfast. 30 tablet 11  . Multiple Vitamin (MULTI VITAMIN DAILY PO) Take 1 tablet by mouth daily.    . vitamin C (ASCORBIC ACID) 500 MG tablet Take 500 mg by mouth daily.    . vitamin E 400 UNIT capsule Take 400 Units by mouth daily.     No current facility-administered medications on file prior to visit.    No Known Allergies   Review of Systems Constitutional: negative for chills, fatigue, fevers and sweats Eyes: negative for irritation, redness and visual disturbance Ears, nose, mouth, throat, and face: negative for hearing loss, nasal congestion, snoring and tinnitus Respiratory: negative for asthma, cough, sputum Cardiovascular: negative for chest pain, dyspnea, exertional chest pressure/discomfort, irregular heart beat, palpitations and syncope Gastrointestinal: negative  for abdominal pain, change in bowel habits, nausea and vomiting Genitourinary: negative for abnormal menstrual periods, genital lesions, sexual problems and vaginal discharge, dysuria and urinary incontinence Integument/breast: negative for breast lump, breast tenderness and nipple discharge Hematologic/lymphatic: negative for bleeding and easy bruising Musculoskeletal:negative for back pain and muscle weakness Neurological: negative for dizziness, headaches, vertigo and weakness Endocrine: negative for diabetic symptoms including polydipsia, polyuria and skin dryness Allergic/Immunologic: negative for hay fever and urticaria       Objective:  Blood pressure 119/74, pulse 51, height 5' 6"  (1.676 m), weight 188  lb 1.6 oz (85.322 kg), last menstrual period 01/03/2016. Body mass index is 30.37 kg/(m^2).  General Appearance:    Alert, cooperative, no distress, appears stated age, mildly obese  Head:    Normocephalic, without obvious abnormality, atraumatic  Eyes:    PERRL, conjunctiva/corneas clear, EOM's intact, both eyes  Ears:    Normal external ear canals, both ears  Nose:   Nares normal, septum midline, mucosa normal, no drainage or sinus tenderness  Throat:   Lips, mucosa, and tongue normal; teeth and gums normal  Neck:   Supple, symmetrical, trachea midline, no adenopathy; thyroid: no enlargement/tenderness/nodules; no carotid bruit or JVD  Back:     Symmetric, no curvature, ROM normal, no CVA tenderness  Lungs:     Clear to auscultation bilaterally, respirations unlabored  Chest Wall:    No tenderness or deformity   Heart:    Regular rate and rhythm, S1 and S2 normal, mild holosystolic murmur, rub or gallop  Breast Exam:    No tenderness, masses, or nipple abnormality  Abdomen:     Soft, non-tender, bowel sounds active all four quadrants, no masses, no organomegaly.    Genitalia:    Pelvic:external genitalia normal, vagina without lesions, discharge, or tenderness, rectovaginal septum  normal. Cervix normal in appearance, no cervical motion tenderness, no adnexal masses or tenderness.  Uterus normal size, shape, mobile, regular contours, nontender.  Rectal:    Normal external sphincter.  No hemorrhoids appreciated. Internal exam not done.   Extremities:   Extremities normal, atraumatic, no cyanosis or edema  Pulses:   2+ and symmetric all extremities  Skin:   Skin color, texture, turgor normal, no rashes or lesions  Lymph nodes:   Cervical, supraclavicular, and axillary nodes normal  Neurologic:   CNII-XII intact, normal strength, sensation and reflexes throughout   .  Labs:  Lab Results  Component Value Date   WBC 5.1 06/28/2015   HGB 13.8 06/28/2015   HCT 42.1 06/28/2015   MCV 86.1  06/28/2015   PLT 193.0 06/28/2015    Lab Results  Component Value Date   CREATININE 0.92 06/28/2015   BUN 11 06/28/2015   NA 139 06/28/2015   K 4.4 06/28/2015   CL 106 06/28/2015   CO2 27 06/28/2015    Lab Results  Component Value Date   ALT 15 06/28/2015   AST 16 06/28/2015   ALKPHOS 72 06/28/2015   BILITOT 0.3 06/28/2015    Lab Results  Component Value Date   TSH 0.87 06/28/2015     Assessment:   Healthy female exam.   Obesity (Class I) Hypothyroidism Mitral valve prolapse  Plan:     Breast self exam technique reviewed and patient encouraged to perform self-exam monthly. Contraception: Mirena IUD. Discussed healthy lifestyle modifications, including diet and exercise. Mammogram ordered. Pap smear performed.   Hypothyroidism and MVP managed by PCP. Follow up in 1 year.     Rubie Maid,  MD [facility]

## 2016-01-07 ENCOUNTER — Encounter: Payer: Self-pay | Admitting: Obstetrics and Gynecology

## 2016-01-07 DIAGNOSIS — E669 Obesity, unspecified: Secondary | ICD-10-CM | POA: Insufficient documentation

## 2016-01-07 DIAGNOSIS — E66811 Other obesity due to excess calories: Secondary | ICD-10-CM | POA: Insufficient documentation

## 2016-01-07 DIAGNOSIS — I341 Nonrheumatic mitral (valve) prolapse: Secondary | ICD-10-CM | POA: Insufficient documentation

## 2016-01-07 LAB — PAP IG AND HPV HIGH-RISK
HPV, HIGH-RISK: NEGATIVE
PAP Smear Comment: 0

## 2016-03-22 ENCOUNTER — Other Ambulatory Visit: Payer: Self-pay | Admitting: Primary Care

## 2016-03-22 DIAGNOSIS — E039 Hypothyroidism, unspecified: Secondary | ICD-10-CM

## 2016-03-27 ENCOUNTER — Ambulatory Visit (INDEPENDENT_AMBULATORY_CARE_PROVIDER_SITE_OTHER): Payer: Commercial Managed Care - HMO | Admitting: Primary Care

## 2016-03-27 ENCOUNTER — Encounter: Payer: Self-pay | Admitting: Primary Care

## 2016-03-27 VITALS — BP 118/78 | HR 56 | Temp 97.5°F | Ht 66.0 in | Wt 189.0 lb

## 2016-03-27 DIAGNOSIS — E039 Hypothyroidism, unspecified: Secondary | ICD-10-CM | POA: Diagnosis not present

## 2016-03-27 DIAGNOSIS — R5383 Other fatigue: Secondary | ICD-10-CM | POA: Diagnosis not present

## 2016-03-27 LAB — BASIC METABOLIC PANEL
BUN: 12 mg/dL (ref 6–23)
CALCIUM: 9 mg/dL (ref 8.4–10.5)
CO2: 28 mEq/L (ref 19–32)
CREATININE: 0.95 mg/dL (ref 0.40–1.20)
Chloride: 103 mEq/L (ref 96–112)
GFR: 68.57 mL/min (ref >60.00)
GLUCOSE: 104 mg/dL — AB (ref 70–99)
POTASSIUM: 4.4 meq/L (ref 3.5–5.1)
Sodium: 138 mEq/L (ref 135–145)

## 2016-03-27 LAB — CBC
HCT: 43.1 % (ref 36.0–46.0)
Hemoglobin: 14.4 g/dL (ref 12.0–15.0)
MCHC: 33.5 g/dL (ref 30.0–36.0)
MCV: 85.4 fl (ref 78.0–100.0)
PLATELETS: 191 10*3/uL (ref 150.0–400.0)
RBC: 5.05 Mil/uL (ref 3.87–5.11)
RDW: 13.4 % (ref 11.5–15.5)
WBC: 5.8 10*3/uL (ref 4.0–10.5)

## 2016-03-27 LAB — VITAMIN B12: VITAMIN B 12: 339 pg/mL (ref 211–911)

## 2016-03-27 LAB — VITAMIN D 25 HYDROXY (VIT D DEFICIENCY, FRACTURES): VITD: 26.36 ng/mL — AB (ref 30.00–100.00)

## 2016-03-27 LAB — TSH: TSH: 1.19 u[IU]/mL (ref 0.35–4.50)

## 2016-03-27 LAB — T4, FREE: Free T4: 0.98 ng/dL (ref 0.60–1.60)

## 2016-03-27 NOTE — Progress Notes (Signed)
Pre visit review using our clinic review tool, if applicable. No additional management support is needed unless otherwise documented below in the visit note. 

## 2016-03-27 NOTE — Assessment & Plan Note (Signed)
Managed on levothyroxine 112 mcg. Experiencing fatigue over the past several months. Repeat TSH and T4 pending. Exam today unremarkable.  Also add on Vitamin B12 and D, CBC.

## 2016-03-27 NOTE — Patient Instructions (Signed)
Complete lab work prior to leaving today. I will notify you of your results once received.   Continue exercise as this will help to reduce fatigue.  Ensure you are eating a balanced diet. Ensure you are staying hydrated with water.  It was a pleasure to see you today!

## 2016-03-27 NOTE — Progress Notes (Signed)
Subjective:    Patient ID: Judith Oneal, female    DOB: 04-10-1976, 40 y.o.   MRN: [id number]  HPI  Judith Oneal is a 40 year old female who presents today for thyroid check. She is currently managed on Levothyroxine 112 mcg. She's noticed feeling more tired around 2-3 pm in the afternoonfor the past 2-3 months. She notices this more so on the weekends or on days she's off work. If she's home then she feels like she could just fall asleep. Denies chest pain, cold intolerance, palpitations, changes in appetite, throat swelling, snoring. She exercises regularly.   Review of Systems  Constitutional: Positive for fatigue.  Respiratory: Negative for shortness of breath.   Cardiovascular: Negative for chest pain and palpitations.  Endocrine: Negative for cold intolerance.  Neurological: Negative for dizziness and weakness.       Past Medical History:  Diagnosis Date  . Cardiac arrhythmia due to congenital heart disease   . Chicken pox   . Dyspareunia in female   . Heart murmur    Mitral valve prolapse  . Hypertension   . Thyroid disease   . Vitamin D deficiency      Social History   Social History  . Marital status: Single    Spouse name: N/A  . Number of children: N/A  . Years of education: N/A   Occupational History  . Not on file.   Social History Main Topics  . Smoking status: Never Smoker  . Smokeless tobacco: Not on file  . Alcohol use 0.0 oz/week     Comment: Occasional use, socially   . Drug use: No  . Sexual activity: Yes    Birth control/ protection: IUD   Other Topics Concern  . Not on file   Social History Narrative   Single.   No children.   Works at [employer] as a Freight forwarder.   Enjoys going to the lake, reading.    Past Surgical History:  Procedure Laterality Date  . POLYPECTOMY  06/2014   polyp removal from uterus and mirena inserted    Family History  Problem Relation Age of Onset  . Arthritis Mother   . Prostate cancer Father   .  Arthritis Maternal Grandfather   . Hyperlipidemia Maternal Grandfather   . Hypertension Maternal Grandfather   . Kidney cancer Maternal Grandfather     No Known Allergies  Current Outpatient Prescriptions on File Prior to Visit  Medication Sig Dispense Refill  . Cholecalciferol (VITAMIN D3) 2000 UNITS TABS Take 2 tablets by mouth daily.    Marland Kitchen levothyroxine (SYNTHROID, LEVOTHROID) 112 MCG tablet TAKE 1 TABLET (112 MCG TOTAL) BY MOUTH DAILY BEFORE BREAKFAST. 30 tablet 3  . Multiple Vitamin (MULTI VITAMIN DAILY PO) Take 1 tablet by mouth daily.    . vitamin C (ASCORBIC ACID) 500 MG tablet Take 500 mg by mouth daily.    . vitamin E 400 UNIT capsule Take 400 Units by mouth daily.     No current facility-administered medications on file prior to visit.     BP 118/78   Pulse (!) 56   Temp 97.5 F (36.4 C) (Oral)   Ht 5' 6"  (1.676 m)   Wt 189 lb (85.7 kg)   SpO2 98%   BMI 30.51 kg/m    Objective:   Physical Exam  Constitutional: She appears well-nourished.  Neck: Neck supple. No thyromegaly present.  Cardiovascular: Normal rate and regular rhythm.   Pulmonary/Chest: Effort normal and breath sounds normal.  Skin: Skin is warm and dry.          Assessment & Plan:

## 2016-06-17 ENCOUNTER — Other Ambulatory Visit: Payer: Self-pay | Admitting: Primary Care

## 2016-06-17 DIAGNOSIS — E039 Hypothyroidism, unspecified: Secondary | ICD-10-CM

## 2016-12-03 ENCOUNTER — Encounter: Payer: Self-pay | Admitting: Obstetrics and Gynecology

## 2016-12-17 ENCOUNTER — Other Ambulatory Visit: Payer: Self-pay | Admitting: Primary Care

## 2016-12-17 DIAGNOSIS — E039 Hypothyroidism, unspecified: Secondary | ICD-10-CM

## 2017-01-08 ENCOUNTER — Encounter: Payer: Commercial Managed Care - HMO | Admitting: Obstetrics and Gynecology

## 2017-03-29 ENCOUNTER — Encounter: Payer: Self-pay | Admitting: Primary Care

## 2017-03-29 ENCOUNTER — Ambulatory Visit (INDEPENDENT_AMBULATORY_CARE_PROVIDER_SITE_OTHER): Payer: 59 | Admitting: Primary Care

## 2017-03-29 VITALS — BP 122/74 | HR 63 | Temp 98.2°F | Ht 66.0 in | Wt 198.0 lb

## 2017-03-29 DIAGNOSIS — E785 Hyperlipidemia, unspecified: Secondary | ICD-10-CM

## 2017-03-29 DIAGNOSIS — E039 Hypothyroidism, unspecified: Secondary | ICD-10-CM

## 2017-03-29 LAB — LIPID PANEL
CHOLESTEROL: 210 mg/dL — AB (ref 0–200)
HDL: 43.7 mg/dL (ref >39.00)
LDL CALC: 136 mg/dL — AB (ref 0–99)
NonHDL: 166.48
TRIGLYCERIDES: 153 mg/dL — AB (ref 0.0–149.0)
Total CHOL/HDL Ratio: 5
VLDL: 30.6 mg/dL (ref 0.0–40.0)

## 2017-03-29 LAB — COMPREHENSIVE METABOLIC PANEL
ALBUMIN: 3.9 g/dL (ref 3.5–5.2)
ALK PHOS: 74 U/L (ref 39–117)
ALT: 17 U/L (ref 0–35)
AST: 17 U/L (ref 0–37)
BILIRUBIN TOTAL: 0.3 mg/dL (ref 0.2–1.2)
BUN: 10 mg/dL (ref 6–23)
CALCIUM: 9.5 mg/dL (ref 8.4–10.5)
CO2: 28 mEq/L (ref 19–32)
CREATININE: 1.02 mg/dL (ref 0.40–1.20)
Chloride: 106 mEq/L (ref 96–112)
GFR: 62.86 mL/min (ref >60.00)
Glucose, Bld: 101 mg/dL — ABNORMAL HIGH (ref 70–99)
Potassium: 4.8 mEq/L (ref 3.5–5.1)
Sodium: 138 mEq/L (ref 135–145)
TOTAL PROTEIN: 7 g/dL (ref 6.0–8.3)

## 2017-03-29 LAB — TSH: TSH: 0.47 u[IU]/mL (ref 0.35–4.50)

## 2017-03-29 NOTE — Patient Instructions (Addendum)
Complete lab work prior to leaving today. I will notify you of your results once received.   Continue exercising. You should be getting 150 minutes of moderate intensity exercise weekly.  Follow up in 1 year for re-evaluation or sooner if needed.  It was a pleasure to see you today!

## 2017-03-29 NOTE — Progress Notes (Signed)
Subjective:    Patient ID: Judith Oneal, female    DOB: 12/10/1975, 41 y.o.   MRN: [id number]  HPI  [name] is a 41 year old female who presents today for follow up. She has no complaints today. Follows with GYN annually and plans on scheduling a mammogram.   1) Hypothyroidism: Currently managed on levothyroxine 112 mcg. She denies fatigue, hair loss, brittle finger nails, palpitations. She is due for repeat TSH.  2) Hyperlipidemia: Present in labs from November 2016, no recent check since. She is fasting today. Over the past 1 month she's been exercising at the gym. She's also working on improvements in her diet.  Review of Systems  Eyes: Negative for visual disturbance.  Respiratory: Negative for shortness of breath.   Cardiovascular: Negative for chest pain and palpitations.  Skin:       Denies hair loss, brittle nails.  Neurological: Negative for dizziness and headaches.       Past Medical History:  Diagnosis Date  . Cardiac arrhythmia due to congenital heart disease   . Chicken pox   . Dyspareunia in female   . Heart murmur    Mitral valve prolapse  . Hypertension   . Thyroid disease   . Vitamin D deficiency      Social History   Social History  . Marital status: Single    Spouse name: N/A  . Number of children: N/A  . Years of education: N/A   Occupational History  . Not on file.   Social History Main Topics  . Smoking status: Never Smoker  . Smokeless tobacco: Never Used  . Alcohol use 0.0 oz/week     Comment: Occasional use, socially   . Drug use: No  . Sexual activity: Yes    Birth control/ protection: IUD   Other Topics Concern  . Not on file   Social History Narrative   Single.   No children.   Works at [employer] as a Freight forwarder.   Enjoys going to the lake, reading.    Past Surgical History:  Procedure Laterality Date  . POLYPECTOMY  06/2014   polyp removal from uterus and mirena inserted    Family History  Problem Relation Age  of Onset  . Arthritis Mother   . Prostate cancer Father   . Arthritis Maternal Grandfather   . Hyperlipidemia Maternal Grandfather   . Hypertension Maternal Grandfather   . Kidney cancer Maternal Grandfather     No Known Allergies  Current Outpatient Prescriptions on File Prior to Visit  Medication Sig Dispense Refill  . Cholecalciferol (VITAMIN D3) 2000 UNITS TABS Take 2 tablets by mouth daily.    Marland Kitchen levothyroxine (SYNTHROID, LEVOTHROID) 112 MCG tablet TAKE 1 TABLET (112 MCG TOTAL) BY MOUTH DAILY BEFORE BREAKFAST. 30 tablet 5  . Multiple Vitamin (MULTI VITAMIN DAILY PO) Take 1 tablet by mouth daily.    . vitamin C (ASCORBIC ACID) 500 MG tablet Take 500 mg by mouth daily.    . vitamin E 400 UNIT capsule Take 400 Units by mouth daily.     No current facility-administered medications on file prior to visit.     BP 122/74   Pulse 63   Temp 98.2 F (36.8 C) (Oral)   Ht 5' 6"  (1.676 m)   Wt 198 lb (89.8 kg)   SpO2 97%   BMI 31.96 kg/m    Objective:   Physical Exam  Constitutional: She appears well-nourished.  Neck: Neck supple. No thyromegaly present.  Cardiovascular: Normal rate and regular rhythm.   Pulmonary/Chest: Effort normal and breath sounds normal.  Skin: Skin is warm and dry.          Assessment & Plan:

## 2017-03-29 NOTE — Assessment & Plan Note (Signed)
No recent lipid panel on file, recheck today. Commended her on regular exercise.

## 2017-03-29 NOTE — Assessment & Plan Note (Signed)
Due for repeat TSH today, pending. Continue levothyroxine 112 mcg for now, will adjust if necessary.

## 2017-06-13 ENCOUNTER — Other Ambulatory Visit: Payer: Self-pay | Admitting: Primary Care

## 2017-06-13 DIAGNOSIS — E039 Hypothyroidism, unspecified: Secondary | ICD-10-CM

## 2017-12-05 ENCOUNTER — Other Ambulatory Visit: Payer: Self-pay | Admitting: Primary Care

## 2017-12-05 DIAGNOSIS — E039 Hypothyroidism, unspecified: Secondary | ICD-10-CM

## 2018-03-07 ENCOUNTER — Ambulatory Visit: Payer: 59 | Admitting: Primary Care

## 2018-03-21 ENCOUNTER — Encounter: Payer: Self-pay | Admitting: Primary Care

## 2018-03-21 ENCOUNTER — Ambulatory Visit: Payer: 59 | Admitting: Primary Care

## 2018-03-21 ENCOUNTER — Other Ambulatory Visit: Payer: Self-pay | Admitting: Primary Care

## 2018-03-21 DIAGNOSIS — E785 Hyperlipidemia, unspecified: Secondary | ICD-10-CM | POA: Diagnosis not present

## 2018-03-21 DIAGNOSIS — E034 Atrophy of thyroid (acquired): Secondary | ICD-10-CM

## 2018-03-21 DIAGNOSIS — E039 Hypothyroidism, unspecified: Secondary | ICD-10-CM

## 2018-03-21 DIAGNOSIS — R739 Hyperglycemia, unspecified: Secondary | ICD-10-CM

## 2018-03-21 LAB — COMPREHENSIVE METABOLIC PANEL
ALBUMIN: 4.2 g/dL (ref 3.5–5.2)
ALK PHOS: 81 U/L (ref 39–117)
ALT: 20 U/L (ref 0–35)
AST: 19 U/L (ref 0–37)
BUN: 13 mg/dL (ref 6–23)
CO2: 31 mEq/L (ref 19–32)
CREATININE: 1.12 mg/dL (ref 0.40–1.20)
Calcium: 9.9 mg/dL (ref 8.4–10.5)
Chloride: 105 mEq/L (ref 96–112)
GFR: 56.18 mL/min — ABNORMAL LOW (ref >60.00)
GLUCOSE: 109 mg/dL — AB (ref 70–99)
Potassium: 4.5 mEq/L (ref 3.5–5.1)
SODIUM: 140 meq/L (ref 135–145)
TOTAL PROTEIN: 7.4 g/dL (ref 6.0–8.3)
Total Bilirubin: 0.5 mg/dL (ref 0.2–1.2)

## 2018-03-21 LAB — LIPID PANEL
CHOLESTEROL: 218 mg/dL — AB (ref 0–200)
HDL: 44.3 mg/dL (ref >39.00)
LDL Cholesterol: 150 mg/dL — ABNORMAL HIGH (ref 0–99)
NonHDL: 173.59
TRIGLYCERIDES: 116 mg/dL (ref 0.0–149.0)
Total CHOL/HDL Ratio: 5
VLDL: 23.2 mg/dL (ref 0.0–40.0)

## 2018-03-21 LAB — TSH: TSH: 0.7 u[IU]/mL (ref 0.35–4.50)

## 2018-03-21 MED ORDER — LEVOTHYROXINE SODIUM 112 MCG PO TABS
ORAL_TABLET | ORAL | 3 refills | Status: DC
Start: 2018-03-21 — End: 2019-04-02

## 2018-03-21 NOTE — Patient Instructions (Signed)
Stop by the lab prior to leaving today. I will notify you of your results once received.   Continue to take your levothyroxine every morning with water only. No food or other medications for 30 minutes. Continue to take your vitamins in the evening.  Continue exercising. You should be getting 150 minutes of moderate intensity exercise weekly.  Continue to work on [employer].  It was a pleasure to see you today!

## 2018-03-21 NOTE — Assessment & Plan Note (Signed)
Repeat TSH pending. Taking levothyroxine appropriately, continue with 112 mcg for now, will adjust accordingly if needed.

## 2018-03-21 NOTE — Assessment & Plan Note (Signed)
Repeat lipids pending. Commended her on regular exercise and dietary changes.

## 2018-03-21 NOTE — Progress Notes (Signed)
Subjective:    Patient ID: Judith Oneal, female    DOB: May 15, 1976, 42 y.o.   MRN: [id number]  HPI  Judith Oneal is a 42 year old female who presents today for follow up.  1) Hypothyroidism: Currently managed on levothyroxine 112 mcg. Her last TSH was normal one year ago.   She's taking taking her levothyroxine every morning with water only, not eating for one hour. She is taking her vitamins in the evening. She denies palpitations, fatigue, hair loss.   2) Hyperlipidemia: Currently not on treatment. Last lipid panel one year ago with TC of 210, LDL of 136, HDL of 43.   She is working on a reduced calorie diet, combination of weight watchers and ketogenic diets. She is exercising at the gym 4-5 days weekly.   Wt Readings from Last 3 Encounters:  03/21/18 199 lb (90.3 kg)  03/29/17 198 lb (89.8 kg)  03/27/16 189 lb (85.7 kg)     BP Readings from Last 3 Encounters:  03/21/18 116/78  03/29/17 122/74  03/27/16 118/78     Review of Systems  Constitutional: Negative for fatigue.  Respiratory: Negative for shortness of breath.   Cardiovascular: Negative for chest pain and palpitations.  Skin:       Denies hair loss       Past Medical History:  Diagnosis Date  . Cardiac arrhythmia due to congenital heart disease   . Chicken pox   . Dyspareunia in female   . Heart murmur    Mitral valve prolapse  . Hypertension   . Thyroid disease   . Vitamin D deficiency      Social History   Socioeconomic History  . Marital status: Single    Spouse name: Not on file  . Number of children: Not on file  . Years of education: Not on file  . Highest education level: Not on file  Occupational History  . Not on file  Social Needs  . Financial resource strain: Not on file  . Food insecurity:    Worry: Not on file    Inability: Not on file  . Transportation needs:    Medical: Not on file    Non-medical: Not on file  Tobacco Use  . Smoking status: Never Smoker  . Smokeless  tobacco: Never Used  Substance and Sexual Activity  . Alcohol use: Yes    Alcohol/week: 0.0 standard drinks    Comment: Occasional use, socially   . Drug use: No  . Sexual activity: Yes    Birth control/protection: IUD  Lifestyle  . Physical activity:    Days per week: Not on file    Minutes per session: Not on file  . Stress: Not on file  Relationships  . Social connections:    Talks on phone: Not on file    Gets together: Not on file    Attends religious service: Not on file    Active member of club or organization: Not on file    Attends meetings of clubs or organizations: Not on file    Relationship status: Not on file  . Intimate partner violence:    Fear of current or ex partner: Not on file    Emotionally abused: Not on file    Physically abused: Not on file    Forced sexual activity: Not on file  Other Topics Concern  . Not on file  Social History Narrative   Single.   No children.   Works at [employer]  as a Freight forwarder.   [place] lake, reading.    Past Surgical History:  Procedure Laterality Date  . POLYPECTOMY  06/2014   polyp removal from uterus and mirena inserted    Family History  Problem Relation Age of Onset  . Arthritis Mother   . Prostate cancer Father   . Arthritis Maternal Grandfather   . Hyperlipidemia Maternal Grandfather   . Hypertension Maternal Grandfather   . Kidney cancer Maternal Grandfather     No Known Allergies  Current Outpatient Medications on File Prior to Visit  Medication Sig Dispense Refill  . Cholecalciferol (VITAMIN D3) 2000 UNITS TABS Take 2 tablets by mouth daily.    Marland Kitchen levothyroxine (SYNTHROID, LEVOTHROID) 112 MCG tablet TAKE 1 TABLET (112 MCG TOTAL) BY MOUTH DAILY BEFORE BREAKFAST. 30 tablet 5  . Multiple Vitamin (MULTI VITAMIN DAILY PO) Take 1 tablet by mouth daily.    . vitamin C (ASCORBIC ACID) 500 MG tablet Take 500 mg by mouth daily.    . vitamin E 400 UNIT capsule Take 400 Units by mouth daily.      No current facility-administered medications on file prior to visit.     BP 116/78   Pulse 64   Temp 98 F (36.7 C) (Oral)   Wt 199 lb (90.3 kg)   BMI 32.12 kg/m    Objective:   Physical Exam  Constitutional: She appears well-nourished.  Neck: Neck supple.  Cardiovascular: Normal rate and regular rhythm.  Respiratory: Effort normal and breath sounds normal.  Skin: Skin is warm and dry.           Assessment & Plan:

## 2018-03-24 ENCOUNTER — Ambulatory Visit: Payer: 59 | Admitting: Primary Care

## 2018-04-17 ENCOUNTER — Encounter: Payer: Self-pay | Admitting: Obstetrics and Gynecology

## 2018-04-18 ENCOUNTER — Encounter: Payer: Self-pay | Admitting: Obstetrics and Gynecology

## 2018-04-18 ENCOUNTER — Ambulatory Visit (INDEPENDENT_AMBULATORY_CARE_PROVIDER_SITE_OTHER): Payer: 59 | Admitting: Obstetrics and Gynecology

## 2018-04-18 ENCOUNTER — Other Ambulatory Visit (HOSPITAL_COMMUNITY)
Admission: RE | Admit: 2018-04-18 | Discharge: 2018-04-18 | Disposition: A | Discharge status: Home / Self Care | Payer: 59 | Source: Ambulatory Visit | Attending: Obstetrics and Gynecology | Admitting: Obstetrics and Gynecology

## 2018-04-18 VITALS — BP 122/79 | HR 78 | Ht 66.0 in | Wt 202.3 lb

## 2018-04-18 DIAGNOSIS — E039 Hypothyroidism, unspecified: Secondary | ICD-10-CM | POA: Insufficient documentation

## 2018-04-18 DIAGNOSIS — E669 Obesity, unspecified: Secondary | ICD-10-CM | POA: Insufficient documentation

## 2018-04-18 DIAGNOSIS — E559 Vitamin D deficiency, unspecified: Secondary | ICD-10-CM | POA: Insufficient documentation

## 2018-04-18 DIAGNOSIS — Z6832 Body mass index (BMI) 32.0-32.9, adult: Secondary | ICD-10-CM | POA: Diagnosis not present

## 2018-04-18 DIAGNOSIS — Z79890 Hormone replacement therapy (postmenopausal): Secondary | ICD-10-CM | POA: Diagnosis not present

## 2018-04-18 DIAGNOSIS — Z01411 Encounter for gynecological examination (general) (routine) with abnormal findings: Secondary | ICD-10-CM | POA: Diagnosis not present

## 2018-04-18 DIAGNOSIS — I341 Nonrheumatic mitral (valve) prolapse: Secondary | ICD-10-CM | POA: Insufficient documentation

## 2018-04-18 DIAGNOSIS — Z01419 Encounter for gynecological examination (general) (routine) without abnormal findings: Secondary | ICD-10-CM

## 2018-04-18 DIAGNOSIS — Z975 Presence of (intrauterine) contraceptive device: Secondary | ICD-10-CM | POA: Diagnosis not present

## 2018-04-18 DIAGNOSIS — Z1231 Encounter for screening mammogram for malignant neoplasm of breast: Secondary | ICD-10-CM

## 2018-04-18 DIAGNOSIS — E785 Hyperlipidemia, unspecified: Secondary | ICD-10-CM | POA: Insufficient documentation

## 2018-04-18 NOTE — Progress Notes (Signed)
GYNECOLOGY ANNUAL PHYSICAL EXAM PROGRESS NOTE  Subjective:    Judith Oneal is a 42 y.o.  female who presents for an annual exam. The patient has no complaints today. The patient is not currently sexually active. The patient wears seatbelts: yes. The patient participates in regular exercise: yes. Has the patient ever been transfused or tattooed?: no. The patient reports that there is not domestic violence in her life.    Gynecologic History Patient's last menstrual period was 04/13/2018. Menarche age: 21 Contraception: IUD  (Mirena, inserted 06/2014) History of STI's: Denies Last Pap: 03/2012. Results were: normal.  Denies h/o abnormal pap smears.    OB History  Gravida Para Term Preterm AB Living  0 0 0 0 0 0  SAB TAB Ectopic Multiple Live Births  0 0 0 0 0    Past Medical History:  Diagnosis Date  . Cardiac arrhythmia due to congenital heart disease   . Chicken pox   . Dyspareunia in female   . Heart murmur    Mitral valve prolapse  . Hypertension   . Thyroid disease   . Vitamin D deficiency     Past Surgical History:  Procedure Laterality Date  . POLYPECTOMY  06/2014   polyp removal from uterus and mirena inserted    Family History  Problem Relation Age of Onset  . Arthritis Mother   . Prostate cancer Father   . Arthritis Maternal Grandfather   . Hyperlipidemia Maternal Grandfather   . Hypertension Maternal Grandfather   . Kidney cancer Maternal Grandfather     Social History   Socioeconomic History  . Marital status: Single    Spouse name: Not on file  . Number of children: Not on file  . Years of education: Not on file  . Highest education level: Not on file  Occupational History  . Not on file  Social Needs  . Financial resource strain: Not on file  . Food insecurity:    Worry: Not on file    Inability: Not on file  . Transportation needs:    Medical: Not on file    Non-medical: Not on file  Tobacco Use  . Smoking status: Never Smoker    . Smokeless tobacco: Never Used  Substance and Sexual Activity  . Alcohol use: Yes    Alcohol/week: 0.0 standard drinks    Comment: Occasional use, socially   . Drug use: No  . Sexual activity: Not Currently    Birth control/protection: IUD  Lifestyle  . Physical activity:    Days per week: Not on file    Minutes per session: Not on file  . Stress: Not on file  Relationships  . Social connections:    Talks on phone: Not on file    Gets together: Not on file    Attends religious service: Not on file    Active member of club or organization: Not on file    Attends meetings of clubs or organizations: Not on file    Relationship status: Not on file  . Intimate partner violence:    Fear of current or ex partner: Not on file    Emotionally abused: Not on file    Physically abused: Not on file    Forced sexual activity: Not on file  Other Topics Concern  . Not on file  Social History Narrative   Single.   No children.   Works at [employer] as a Freight forwarder.   Enjo[place] lake,  reading.    Current Outpatient Medications on File Prior to Visit  Medication Sig Dispense Refill  . Cholecalciferol (VITAMIN D3) 2000 UNITS TABS Take 2 tablets by mouth daily.    . folic acid (FOLVITE) 681 MCG tablet Take 400 mcg by mouth daily.    Marland Kitchen levothyroxine (SYNTHROID, LEVOTHROID) 112 MCG tablet Take 1 tablet by mouth every morning on an empty stomach with water only. No food or other medications for 30 minutes. 90 tablet 3  . Multiple Vitamin (MULTI VITAMIN DAILY PO) Take 1 tablet by mouth daily.    . vitamin C (ASCORBIC ACID) 500 MG tablet Take 500 mg by mouth daily.    . vitamin E 400 UNIT capsule Take 400 Units by mouth daily.     No current facility-administered medications on file prior to visit.     No Known Allergies   Review of Systems Constitutional: negative for chills, fatigue, fevers and sweats Eyes: negative for irritation, redness and visual disturbance Ears, nose,  mouth, throat, and face: negative for hearing loss, nasal congestion, snoring and tinnitus Respiratory: negative for asthma, cough, sputum Cardiovascular: negative for chest pain, dyspnea, exertional chest pressure/discomfort, irregular heart beat, palpitations and syncope Gastrointestinal: negative for abdominal pain, change in bowel habits, nausea and vomiting Genitourinary: negative for abnormal menstrual periods, genital lesions, sexual problems and vaginal discharge, dysuria and urinary incontinence Integument/breast: negative for breast lump, breast tenderness and nipple discharge Hematologic/lymphatic: negative for bleeding and easy bruising Musculoskeletal:negative for back pain and muscle weakness Neurological: negative for dizziness, headaches, vertigo and weakness Endocrine: negative for diabetic symptoms including polydipsia, polyuria and skin dryness Allergic/Immunologic: negative for hay fever and urticaria       Objective:  Blood pressure 122/79, pulse 78, height 5' 6"  (1.676 m), weight 202 lb 4.8 oz (91.8 kg), last menstrual period 04/13/2018. Body mass index is 32.65 kg/m.  General Appearance:    Alert, cooperative, no distress, appears stated age, mildly obese  Head:    Normocephalic, without obvious abnormality, atraumatic  Eyes:    PERRL, conjunctiva/corneas clear, EOM's intact, both eyes  Ears:    Normal external ear canals, both ears  Nose:   Nares normal, septum midline, mucosa normal, no drainage or sinus tenderness  Throat:   Lips, mucosa, and tongue normal; teeth and gums normal  Neck:   Supple, symmetrical, trachea midline, no adenopathy; thyroid: no enlargement/tenderness/nodules; no carotid bruit or JVD  Back:     Symmetric, no curvature, ROM normal, no CVA tenderness  Lungs:     Clear to auscultation bilaterally, respirations unlabored  Chest Wall:    No tenderness or deformity   Heart:    Regular rate and rhythm, S1 and S2 normal, no murmurs, rub or gallop    Breast Exam:    No tenderness, masses, or nipple abnormality  Abdomen:     Soft, non-tender, bowel sounds active all four quadrants, no masses, no organomegaly.    Genitalia:    Pelvic:external genitalia normal, vagina without lesions, discharge, or tenderness, rectovaginal septum  normal. Cervix normal in appearance, no cervical motion tenderness, no adnexal masses or tenderness.  Uterus normal size, shape, mobile, regular contours, nontender.  Rectal:    Normal external sphincter.  No hemorrhoids appreciated. Internal exam not done.   Extremities:   Extremities normal, atraumatic, no cyanosis or edema  Pulses:   2+ and symmetric all extremities  Skin:   Skin color, texture, turgor normal, no rashes or lesions  Lymph nodes:   Cervical, supraclavicular,  and axillary nodes normal  Neurologic:   CNII-XII intact, normal strength, sensation and reflexes throughout   .  Labs:  Lab Results  Component Value Date   WBC 5.8 03/27/2016   HGB 14.4 03/27/2016   HCT 43.1 03/27/2016   MCV 85.4 03/27/2016   PLT 191.0 03/27/2016    Lab Results  Component Value Date   CREATININE 1.12 03/21/2018   BUN 13 03/21/2018   NA 140 03/21/2018   K 4.5 03/21/2018   CL 105 03/21/2018   CO2 31 03/21/2018    Lab Results  Component Value Date   ALT 20 03/21/2018   AST 19 03/21/2018   ALKPHOS 81 03/21/2018   BILITOT 0.5 03/21/2018    Lab Results  Component Value Date   TSH 0.70 03/21/2018    Lab Results  Component Value Date   CHOL 218 (H) 03/21/2018   HDL 44.30 03/21/2018   LDLCALC 150 (H) 03/21/2018   TRIG 116.0 03/21/2018   CHOLHDL 5 03/21/2018      Assessment:   Healthy female exam.   Obesity (Class I) Hypothyroidism Mitral valve prolapse Dyslipidemia Vitamin D insufficiency  Plan:     Breast self exam technique reviewed and patient encouraged to perform self-exam monthly. Contraception: Mirena IUD. Due for removal in 1 year Discussed healthy lifestyle modifications,  including diet and exercise. Mammogram ordered. Pap smear performed.   Hypothyroidism, Dyslipidemia, and MVP managed by PCP. Declines flu vaccine.  Vitamin D ordered. Follow up in 1 year.     Rubie Maid, MD [facility]

## 2018-04-18 NOTE — Patient Instructions (Signed)

## 2018-04-18 NOTE — Progress Notes (Signed)
PT is present today for her annual exam. Pt stated that she has been doing self-breast exams monthly. Pt stated that she is doing well and denies any issues. No problems or concerns.

## 2018-04-18 NOTE — Addendum Note (Signed)
Addended by: Edwyna Shell on: 04/18/2018 09:45 AM   Modules accepted: Orders

## 2018-04-19 LAB — VITAMIN D 25 HYDROXY (VIT D DEFICIENCY, FRACTURES): VIT D 25 HYDROXY: 35.3 ng/mL (ref 30.0–100.0)

## 2018-04-23 LAB — CYTOLOGY - PAP
Diagnosis: NEGATIVE
HPV 16/18/45 genotyping: NEGATIVE
HPV: DETECTED — AB

## 2018-04-30 ENCOUNTER — Other Ambulatory Visit: Payer: Self-pay

## 2018-04-30 NOTE — Telephone Encounter (Signed)
Pt was called and informed that her appointment for her mammogram was rescheduled for Friday, May 23, 2018 @ 3pm.

## 2018-04-30 NOTE — Telephone Encounter (Signed)
Pt was called no answer LM via voicemail that I was asked by [facility] to make her an appointment for a mammogram. Pt was informed that a appointment with [facility] on [address] had been scheduled for her for Friday, Sept 27, 2019 @ 12:40pm.

## 2018-04-30 NOTE — Telephone Encounter (Signed)
Pt was called no answer LM via voicemail that I was asked by [facility] to make her an appointment for a mammogram. Pt was informed that a appointment with [facility] on [address] had been scheduled for her for Friday, Sept 27, 2019 @ 12:40pm.

## 2018-05-23 ENCOUNTER — Ambulatory Visit
Admission: RE | Admit: 2018-05-23 | Discharge: 2018-05-23 | Disposition: A | Discharge status: Home / Self Care | Payer: 59 | Source: Ambulatory Visit | Attending: Obstetrics and Gynecology | Admitting: Obstetrics and Gynecology

## 2018-05-23 DIAGNOSIS — Z01419 Encounter for gynecological examination (general) (routine) without abnormal findings: Secondary | ICD-10-CM | POA: Diagnosis not present

## 2018-05-23 DIAGNOSIS — Z1231 Encounter for screening mammogram for malignant neoplasm of breast: Secondary | ICD-10-CM | POA: Diagnosis not present

## 2019-04-02 ENCOUNTER — Encounter: Payer: Self-pay | Admitting: Primary Care

## 2019-04-02 ENCOUNTER — Other Ambulatory Visit: Payer: Self-pay

## 2019-04-02 ENCOUNTER — Ambulatory Visit: Payer: 59 | Admitting: Primary Care

## 2019-04-02 VITALS — BP 122/80 | HR 65 | Temp 97.8°F | Ht 66.0 in | Wt 201.5 lb

## 2019-04-02 DIAGNOSIS — E785 Hyperlipidemia, unspecified: Secondary | ICD-10-CM

## 2019-04-02 DIAGNOSIS — E039 Hypothyroidism, unspecified: Secondary | ICD-10-CM | POA: Diagnosis not present

## 2019-04-02 DIAGNOSIS — N289 Disorder of kidney and ureter, unspecified: Secondary | ICD-10-CM | POA: Insufficient documentation

## 2019-04-02 DIAGNOSIS — R739 Hyperglycemia, unspecified: Secondary | ICD-10-CM | POA: Diagnosis not present

## 2019-04-02 HISTORY — DX: Hyperglycemia, unspecified: R73.9

## 2019-04-02 LAB — LIPID PANEL
Cholesterol: 225 mg/dL — ABNORMAL HIGH (ref 0–200)
HDL: 45.8 mg/dL (ref >39.00)
LDL Cholesterol: 156 mg/dL — ABNORMAL HIGH (ref 0–99)
NonHDL: 178.83
Total CHOL/HDL Ratio: 5
Triglycerides: 112 mg/dL (ref 0.0–149.0)
VLDL: 22.4 mg/dL (ref 0.0–40.0)

## 2019-04-02 LAB — COMPREHENSIVE METABOLIC PANEL
ALT: 16 U/L (ref 0–35)
AST: 20 U/L (ref 0–37)
Albumin: 4.1 g/dL (ref 3.5–5.2)
Alkaline Phosphatase: 76 U/L (ref 39–117)
BUN: 12 mg/dL (ref 6–23)
CO2: 28 mEq/L (ref 19–32)
Calcium: 9.3 mg/dL (ref 8.4–10.5)
Chloride: 104 mEq/L (ref 96–112)
Creatinine, Ser: 1.04 mg/dL (ref 0.40–1.20)
GFR: 57.3 mL/min — ABNORMAL LOW (ref >60.00)
Glucose, Bld: 92 mg/dL (ref 70–99)
Potassium: 4.3 mEq/L (ref 3.5–5.1)
Sodium: 138 mEq/L (ref 135–145)
Total Bilirubin: 0.4 mg/dL (ref 0.2–1.2)
Total Protein: 7.3 g/dL (ref 6.0–8.3)

## 2019-04-02 LAB — TSH: TSH: 0.62 u[IU]/mL (ref 0.35–4.50)

## 2019-04-02 LAB — HEMOGLOBIN A1C: Hgb A1c MFr Bld: 5.6 % (ref 4.6–6.5)

## 2019-04-02 MED ORDER — LEVOTHYROXINE SODIUM 112 MCG PO TABS: ORAL_TABLET | ORAL | 3 refills | Status: DC

## 2019-04-02 NOTE — Assessment & Plan Note (Signed)
Noted on labs from one year ago, repeat renal function pending.

## 2019-04-02 NOTE — Progress Notes (Signed)
Subjective:    Patient ID: Judith Oneal, female    DOB: February 22, 1976, 43 y.o.   MRN: [id number]  HPI  Ms. Pilant is a 43 year old female who presents today for follow up.  1) Hypothyroidism: Currently managed on levothyroxine 112 mcg TSH from August 2019 of 0.70. She is taking her levothyroxine every morning with water only. She eats about one hour later and doesn't take any other medications with her levothyroxine. She takes her vitamins in the evening.   She denies fatigue, hair loss, cold intolerance, chest pain.   2) Decreased Renal Function: GFR of 56.18 with creatinine of 1.12 on labs from August 2019. GFR of 62.86 with creatinine of 1.02 in August 2018. She doesn't take NSAID's. She drinks a lot of water during the day.   BP Readings from Last 3 Encounters:  04/02/19 122/80  04/18/18 122/79  03/21/18 116/78     Review of Systems  Eyes: Negative for visual disturbance.  Respiratory: Negative for shortness of breath.   Cardiovascular: Negative for chest pain.  Endocrine: Negative for cold intolerance.  Neurological: Negative for dizziness and headaches.       Past Medical History:  Diagnosis Date  . Cardiac arrhythmia due to congenital heart disease   . Chicken pox   . Dyspareunia in female   . Heart murmur    Mitral valve prolapse  . Hypertension   . Thyroid disease   . Vitamin D deficiency      Social History   Socioeconomic History  . Marital status: Divorced    Spouse name: Not on file  . Number of children: Not on file  . Years of education: Not on file  . Highest education level: Not on file  Occupational History  . Not on file  Social Needs  . Financial resource strain: Not on file  . Food insecurity    Worry: Not on file    Inability: Not on file  . Transportation needs    Medical: Not on file    Non-medical: Not on file  Tobacco Use  . Smoking status: Never Smoker  . Smokeless tobacco: Never Used  Substance and Sexual Activity  . Alcohol  use: Yes    Alcohol/week: 0.0 standard drinks    Comment: Occasional use, socially   . Drug use: No  . Sexual activity: Not Currently    Birth control/protection: I.U.D.  Lifestyle  . Physical activity    Days per week: Not on file    Minutes per session: Not on file  . Stress: Not on file  Relationships  . Social Herbalist on phone: Not on file    Gets together: Not on file    Attends religious service: Not on file    Active member of club or organization: Not on file    Attends meetings of clubs or organizations: Not on file    Relationship status: Not on file  . Intimate partner violence    Fear of current or ex partner: Not on file    Emotionally abused: Not on file    Physically abused: Not on file    Forced sexual activity: Not on file  Other Topics Concern  . Not on file  Social History Narrative   Single.   No children.   Works at [employer] as a Freight forwarder.   [place] lake, reading.    Past Surgical History:  Procedure Laterality Date  . POLYPECTOMY  06/2014   polyp removal from uterus and mirena inserted    Family History  Problem Relation Age of Onset  . Arthritis Mother   . Prostate cancer Father   . Lymphoma Father   . Arthritis Maternal Grandfather   . Hyperlipidemia Maternal Grandfather   . Hypertension Maternal Grandfather   . Kidney cancer Maternal Grandfather     No Known Allergies  Current Outpatient Medications on File Prior to Visit  Medication Sig Dispense Refill  . Cholecalciferol (VITAMIN D3) 2000 UNITS TABS Take 2 tablets by mouth daily.    . folic acid (FOLVITE) 193 MCG tablet Take 400 mcg by mouth daily.    Marland Kitchen levonorgestrel (MIRENA) 20 MCG/24HR IUD 1 each by Intrauterine route once.    . Multiple Vitamin (MULTI VITAMIN DAILY PO) Take 1 tablet by mouth daily.    . vitamin C (ASCORBIC ACID) 500 MG tablet Take 500 mg by mouth daily.    . vitamin E 400 UNIT capsule Take 400 Units by mouth daily.     No current  facility-administered medications on file prior to visit.     BP 122/80   Pulse 65   Temp 97.8 F (36.6 C) (Temporal)   Ht 5' 6"  (1.676 m)   Wt 201 lb 8 oz (91.4 kg)   SpO2 98%   BMI 32.52 kg/m    Objective:   Physical Exam  Constitutional: She appears well-nourished.  Neck: Neck supple.  Cardiovascular: Normal rate and regular rhythm.  Respiratory: Effort normal and breath sounds normal.  Skin: Skin is warm and dry.  Psychiatric: She has a normal mood and affect.           Assessment & Plan:

## 2019-04-02 NOTE — Assessment & Plan Note (Signed)
Noted on prior labs, A1C pending.

## 2019-04-02 NOTE — Assessment & Plan Note (Signed)
Repeat lipid panel pending.

## 2019-04-02 NOTE — Patient Instructions (Signed)
Stop by the lab prior to leaving today. I will notify you of your results once received.   Be sure to take your levothyroxine (thyroid medication) every morning on an empty stomach with water only. No food or other medications for 30 minutes. No heartburn medication, iron pills, calcium, vitamin D, or magnesium pills within four hours of taking levothyroxine.   It was a pleasure to see you today!  

## 2019-04-02 NOTE — Assessment & Plan Note (Signed)
Compliant to levothyroxine and is taking appropriately. Repeat TSH pending. Refill sent to pharmacy, notified patient not to pick up until she hears from me in regards to her lab.

## 2019-04-24 ENCOUNTER — Other Ambulatory Visit: Payer: Self-pay

## 2019-04-24 ENCOUNTER — Encounter: Payer: Self-pay | Admitting: Obstetrics and Gynecology

## 2019-04-24 ENCOUNTER — Ambulatory Visit (INDEPENDENT_AMBULATORY_CARE_PROVIDER_SITE_OTHER): Payer: 59 | Admitting: Obstetrics and Gynecology

## 2019-04-24 VITALS — BP 120/79 | HR 69 | Ht 66.0 in | Wt 203.5 lb

## 2019-04-24 DIAGNOSIS — E559 Vitamin D deficiency, unspecified: Secondary | ICD-10-CM

## 2019-04-24 DIAGNOSIS — Z01419 Encounter for gynecological examination (general) (routine) without abnormal findings: Secondary | ICD-10-CM

## 2019-04-24 DIAGNOSIS — E039 Hypothyroidism, unspecified: Secondary | ICD-10-CM

## 2019-04-24 DIAGNOSIS — I341 Nonrheumatic mitral (valve) prolapse: Secondary | ICD-10-CM | POA: Diagnosis not present

## 2019-04-24 DIAGNOSIS — Z1231 Encounter for screening mammogram for malignant neoplasm of breast: Secondary | ICD-10-CM | POA: Diagnosis not present

## 2019-04-24 DIAGNOSIS — Z30432 Encounter for removal of intrauterine contraceptive device: Secondary | ICD-10-CM

## 2019-04-24 DIAGNOSIS — E669 Obesity, unspecified: Secondary | ICD-10-CM

## 2019-04-24 DIAGNOSIS — E785 Hyperlipidemia, unspecified: Secondary | ICD-10-CM

## 2019-04-24 DIAGNOSIS — E66811 Obesity, class 1: Secondary | ICD-10-CM

## 2019-04-24 NOTE — Progress Notes (Signed)
GYNECOLOGY ANNUAL PHYSICAL EXAM PROGRESS NOTE  Subjective:    Judith Oneal is a 43 y.o.  female who presents for an annual exam. The patient has no complaints today. The patient is not sexually active. The patient wears seatbelts: yes. The patient participates in regular exercise: yes (4-5 days per week). Has the patient ever been transfused or tattooed?: no. The patient reports that there is not domestic violence in her life.   Patient reports she is doing well.  Is working from home since the onset of the COVID-19 pandemic and enjoys it.   Gynecologic History  No LMP recorded. (Menstrual status: IUD).  Occasionally, light cycles every  Now and then.  Menarche age: 28 Contraception: IUD  (Mirena, inserted 06/2014) History of STI's: Denies.  Last Pap: 04/18/2018. Results were: NILM, HR HPV+ (but neg for types 16/18/45).  Denies h/o abnormal pap smears. Last mammogram: 05/23/2018.  Results were: normal    OB History  Gravida Para Term Preterm AB Living  0 0 0 0 0 0  SAB TAB Ectopic Multiple Live Births  0 0 0 0 0    Past Medical History:  Diagnosis Date  . Cardiac arrhythmia due to congenital heart disease   . Chicken pox   . Dyspareunia in female   . Heart murmur    Mitral valve prolapse  . Hypertension   . Thyroid disease   . Vitamin D deficiency     Past Surgical History:  Procedure Laterality Date  . POLYPECTOMY  06/2014   polyp removal from uterus and mirena inserted    Family History  Problem Relation Age of Onset  . Arthritis Mother   . Prostate cancer Father   . Lymphoma Father   . Arthritis Maternal Grandfather   . Hyperlipidemia Maternal Grandfather   . Hypertension Maternal Grandfather   . Kidney cancer Maternal Grandfather     Social History   Socioeconomic History  . Marital status: Divorced    Spouse name: Not on file  . Number of children: Not on file  . Years of education: Not on file  . Highest education level: Not on file   Occupational History  . Not on file  Social Needs  . Financial resource strain: Not on file  . Food insecurity    Worry: Not on file    Inability: Not on file  . Transportation needs    Medical: Not on file    Non-medical: Not on file  Tobacco Use  . Smoking status: Never Smoker  . Smokeless tobacco: Never Used  Substance and Sexual Activity  . Alcohol use: Not Currently    Alcohol/week: 0.0 standard drinks    Comment: Occasional use, socially   . Drug use: No  . Sexual activity: Not Currently    Birth control/protection: I.U.D.  Lifestyle  . Physical activity    Days per week: Not on file    Minutes per session: Not on file  . Stress: Not on file  Relationships  . Social Herbalist on phone: Not on file    Gets together: Not on file    Attends religious service: Not on file    Active member of club or organization: Not on file    Attends meetings of clubs or organizations: Not on file    Relationship status: Not on file  . Intimate partner violence    Fear of current or ex partner: Not on file    Emotionally abused: Not  on file    Physically abused: Not on file    Forced sexual activity: Not on file  Other Topics Concern  . Not on file  Social History Narrative   Single.   No children.   Works at [employer] as a Freight forwarder.   [place]jo[place] lake, reading.    Current Outpatient Medications on File Prior to Visit  Medication Sig Dispense Refill  . Cholecalciferol (VITAMIN D3) 2000 UNITS TABS Take 2 tablets by mouth daily.    . folic acid (FOLVITE) 409 MCG tablet Take 400 mcg by mouth daily.    Marland Kitchen levonorgestrel (MIRENA) 20 MCG/24HR IUD 1 each by Intrauterine route once.    Marland Kitchen levothyroxine (SYNTHROID) 112 MCG tablet Take 1 tablet by mouth every morning on an empty stomach with water only. No food or other medications for 30 minutes. 90 tablet 3  . Multiple Vitamin (MULTI VITAMIN DAILY PO) Take 1 tablet by mouth daily.    . vitamin C (ASCORBIC ACID)  500 MG tablet Take 500 mg by mouth daily.    . vitamin E 400 UNIT capsule Take 400 Units by mouth daily.     No current facility-administered medications on file prior to visit.     No Known Allergies   Review of Systems Constitutional: negative for chills, fatigue, fevers and sweats Eyes: negative for irritation, redness and visual disturbance Ears, nose, mouth, throat, and face: negative for hearing loss, nasal congestion, snoring and tinnitus Respiratory: negative for asthma, cough, sputum Cardiovascular: negative for chest pain, dyspnea, exertional chest pressure/discomfort, irregular heart beat, palpitations and syncope Gastrointestinal: negative for abdominal pain, change in bowel habits, nausea and vomiting Genitourinary: negative for abnormal menstrual periods, genital lesions, sexual problems and vaginal discharge, dysuria and urinary incontinence Integument/breast: negative for breast lump, breast tenderness and nipple discharge Hematologic/lymphatic: negative for bleeding and easy bruising Musculoskeletal:negative for back pain and muscle weakness Neurological: negative for dizziness, headaches, vertigo and weakness Endocrine: negative for diabetic symptoms including polydipsia, polyuria and skin dryness Allergic/Immunologic: negative for hay fever and urticaria     Objective:  Blood pressure 120/79, pulse 69, height 5' 6"  (1.676 m), weight 203 lb 8 oz (92.3 kg). Body mass index is 32.85 kg/m.  General Appearance:    Alert, cooperative, no distress, appears stated age, mildly obese  Head:    Normocephalic, without obvious abnormality, atraumatic  Eyes:    PERRL, conjunctiva/corneas clear, EOM's intact, both eyes  Ears:    Normal external ear canals, both ears  Nose:   Nares normal, septum midline, mucosa normal, no drainage or sinus tenderness  Throat:   Lips, mucosa, and tongue normal; teeth and gums normal  Neck:   Supple, symmetrical, trachea midline, no adenopathy;  thyroid: no enlargement/tenderness/nodules; no carotid bruit or JVD  Back:     Symmetric, no curvature, ROM normal, no CVA tenderness  Lungs:     Clear to auscultation bilaterally, respirations unlabored  Chest Wall:    No tenderness or deformity   Heart:    Regular rate and rhythm, S1 and S2 normal, no murmurs, rub or gallop  Breast Exam:    No tenderness, masses, or nipple abnormality  Abdomen:     Soft, non-tender, bowel sounds active all four quadrants, no masses, no organomegaly.    Genitalia:    Pelvic:external genitalia normal, vagina without lesions, discharge, or tenderness, rectovaginal septum  normal. Cervix normal in appearance, no cervical motion tenderness, no adnexal masses or tenderness.  Uterus normal  size, shape, mobile, regular contours, nontender.  Rectal:    Normal external sphincter.  No hemorrhoids appreciated. Internal exam not done.   Extremities:   Extremities normal, atraumatic, no cyanosis or edema  Pulses:   2+ and symmetric all extremities  Skin:   Skin color, texture, turgor normal, no rashes or lesions  Lymph nodes:   Cervical, supraclavicular, and axillary nodes normal  Neurologic:   CNII-XII intact, normal strength, sensation and reflexes throughout   .  Labs:  Lab Results  Component Value Date   WBC 5.8 03/27/2016   HGB 14.4 03/27/2016   HCT 43.1 03/27/2016   MCV 85.4 03/27/2016   PLT 191.0 03/27/2016    Lab Results  Component Value Date   CREATININE 1.04 04/02/2019   BUN 12 04/02/2019   NA 138 04/02/2019   K 4.3 04/02/2019   CL 104 04/02/2019   CO2 28 04/02/2019    Lab Results  Component Value Date   ALT 16 04/02/2019   AST 20 04/02/2019   ALKPHOS 76 04/02/2019   BILITOT 0.4 04/02/2019    Lab Results  Component Value Date   TSH 0.62 04/02/2019    Lab Results  Component Value Date   CHOL 225 (H) 04/02/2019   HDL 45.80 04/02/2019   LDLCALC 156 (H) 04/02/2019   TRIG 112.0 04/02/2019   CHOLHDL 5 04/02/2019      Assessment:    Encounter for well woman exam with routine gynecological exam Breast cancer screening by mammogram Vitamin D deficiency Obesity (BMI 30.0-34.9) Mitral valve prolapse Hyperlipidemia, unspecified hyperlipidemia type Acquired hypothyroidism Encounter for IUD removal  Plan:     Breast self exam technique reviewed and patient encouraged to perform self-exam monthly. Contraception: Mirena IUD. Due for removal in 2 months.  Patient desires to go ahead and remove today. See procedure note below.  Discussed healthy lifestyle modifications, including diet and exercise. Mammogram ordered. Pap smear up to date.  HR HPV types negative, so can continue q 3 year screening. Marland Kitchen   Hypothyroidism, Dyslipidemia, and MVP managed by PCP. Declines flu vaccine.  Vitamin D level declined today. Follow up in 1 year.      GYNECOLOGY OFFICE PROCEDURE NOTE IUD Removal  Patient identified, informed consent performed, consent signed.  Patient was in the dorsal lithotomy position, normal external genitalia was noted.  A speculum was placed in the patient's vagina, normal discharge was noted, no lesions. The cervix was visualized, no lesions, no abnormal discharge.  The strings of the IUD were grasped and pulled using ring forceps. The IUD was removed in its entirety.  Patient tolerated the procedure well.    Patient will use abstinence for contraception. Routine preventative health maintenance measures emphasized.    Rubie Maid, MD [facility]

## 2019-04-24 NOTE — Progress Notes (Signed)
Pt is present for annual exam. Pt stated that she was doing well no problems at this time. Pt declined flu vaccine.

## 2019-04-24 NOTE — Patient Instructions (Addendum)
Preventive Care 40-45 Years Old, Female Preventive care refers to visits with your health care provider and lifestyle choices that can promote health and wellness. This includes:  A yearly physical exam. This may also be called an annual well check.  Regular dental visits and eye exams.  Immunizations.  Screening for certain conditions.  Healthy lifestyle choices, such as eating a healthy diet, getting regular exercise, not using drugs or products that contain nicotine and tobacco, and limiting alcohol use. What can I expect for my preventive care visit? Physical exam Your health care provider will check your:  Height and weight. This may be used to calculate body mass index (BMI), which tells if you are at a healthy weight.  Heart rate and blood pressure.  Skin for abnormal spots. Counseling Your health care provider may ask you questions about your:  Alcohol, tobacco, and drug use.  Emotional well-being.  Home and relationship well-being.  Sexual activity.  Eating habits.  Work and work environment.  Method of birth control.  Menstrual cycle.  Pregnancy history. What immunizations do I need?  Influenza (flu) vaccine  This is recommended every year. Tetanus, diphtheria, and pertussis (Tdap) vaccine  You may need a Td booster every 10 years. Varicella (chickenpox) vaccine  You may need this if you have not been vaccinated. Zoster (shingles) vaccine  You may need this after age 60. Measles, mumps, and rubella (MMR) vaccine  You may need at least one dose of MMR if you were born in 1957 or later. You may also need a second dose. Pneumococcal conjugate (PCV13) vaccine  You may need this if you have certain conditions and were not previously vaccinated. Pneumococcal polysaccharide (PPSV23) vaccine  You may need one or two doses if you smoke cigarettes or if you have certain conditions. Meningococcal conjugate (MenACWY) vaccine  You may need this if you  have certain conditions. Hepatitis A vaccine  You may need this if you have certain conditions or if you travel or work in places where you may be exposed to hepatitis A. Hepatitis B vaccine  You may need this if you have certain conditions or if you travel or work in places where you may be exposed to hepatitis B. Haemophilus influenzae type b (Hib) vaccine  You may need this if you have certain conditions. Human papillomavirus (HPV) vaccine  If recommended by your health care provider, you may need three doses over 6 months. You may receive vaccines as individual doses or as more than one vaccine together in one shot (combination vaccines). Talk with your health care provider about the risks and benefits of combination vaccines. What tests do I need? Blood tests  Lipid and cholesterol levels. These may be checked every 5 years, or more frequently if you are over 50 years old.  Hepatitis C test.  Hepatitis B test. Screening  Lung cancer screening. You may have this screening every year starting at age 55 if you have a 30-pack-year history of smoking and currently smoke or have quit within the past 15 years.  Colorectal cancer screening. All adults should have this screening starting at age 50 and continuing until age 75. Your health care provider may recommend screening at age 45 if you are at increased risk. You will have tests every 1-10 years, depending on your results and the type of screening test.  Diabetes screening. This is done by checking your blood sugar (glucose) after you have not eaten for a while (fasting). You may have this   done every 1-3 years.  Mammogram. This may be done every 1-2 years. Talk with your health care provider about when you should start having regular mammograms. This may depend on whether you have a family history of breast cancer.  BRCA-related cancer screening. This may be done if you have a family history of breast, ovarian, tubal, or peritoneal  cancers.  Pelvic exam and Pap test. This may be done every 3 years starting at age 38. Starting at age 59, this may be done every 5 years if you have a Pap test in combination with an HPV test. Other tests  Sexually transmitted disease (STD) testing.  Bone density scan. This is done to screen for osteoporosis. You may have this scan if you are at high risk for osteoporosis. Follow these instructions at home: Eating and drinking  Eat a diet that includes fresh fruits and vegetables, whole grains, lean protein, and low-fat dairy.  Take vitamin and mineral supplements as recommended by your health care provider.  Do not drink alcohol if: ? Your health care provider tells you not to drink. ? You are pregnant, may be pregnant, or are planning to become pregnant.  If you drink alcohol: ? Limit how much you have to 0-1 drink a day. ? Be aware of how much alcohol is in your drink. In the [place] one drink equals one 12 oz bottle of beer (355 mL), one 5 oz glass of wine (148 mL), or one 1 oz glass of hard liquor (44 mL). Lifestyle  Take daily care of your teeth and gums.  Stay active. Exercise for at least 30 minutes on 5 or more days each week.  Do not use any products that contain nicotine or tobacco, such as cigarettes, e-cigarettes, and chewing tobacco. If you need help quitting, ask your health care provider.  If you are sexually active, practice safe sex. Use a condom or other form of birth control (contraception) in order to prevent pregnancy and STIs (sexually transmitted infections).  If told by your health care provider, take low-dose aspirin daily starting at age 50. What's next?  Visit your health care provider once a year for a well check visit.  Ask your health care provider how often you should have your eyes and teeth checked.  Stay up to date on all vaccines. This information is not intended to replace advice given to you by your health care provider. Make sure you  discuss any questions you have with your health care provider. Document Released: 08/19/2015 Document Revised: 04/03/2018 Document Reviewed: 04/03/2018 Elsevier Patient Education  2020 [place] self-awareness is knowing how your breasts look and feel. Doing breast self-awareness is important. It allows you to catch a breast problem early while it is still small and can be treated. All women should do breast self-awareness, including women who have had breast implants. Tell your doctor if you notice a change in your breasts. What you need:  A mirror.  A well-lit room. How to do a breast self-exam A breast self-exam is one way to learn what is normal for your breasts and to check for changes. To do a breast self-exam: Look for changes  1. Take off all the clothes above your waist. 2. Stand in front of a mirror in a room with good lighting. 3. Put your hands on your hips. 4. Push your hands down. 5. Look at your breasts and nipples in the mirror to see if one breast or nipple  different from the other. Check to see if: ? The shape of one breast is different. ? The size of one breast is different. ? There are wrinkles, dips, and bumps in one breast and not the other. 6. Look at each breast for changes in the skin, such as: ? Redness. ? Scaly areas. 7. Look for changes in your nipples, such as: ? Liquid around the nipples. ? Bleeding. ? Dimpling. ? Redness. ? A change in where the nipples are. Feel for changes  1. Lie on your back on the floor. 2. Feel each breast. To do this, follow these steps: ? Pick a breast to feel. ? Put the arm closest to that breast above your head. ? Use your other arm to feel the nipple area of your breast. Feel the area with the pads of your three middle fingers by making small circles with your fingers. For the first circle, press lightly. For the second circle, press harder. For the third circle, press even harder.  ? Keep making circles with your fingers at the different pressures as you move down your breast. Stop when you feel your ribs. ? Move your fingers a little toward the center of your body. ? Start making circles with your fingers again, this time going up until you reach your collarbone. ? Keep making up-and-down circles until you reach your armpit. Remember to keep using the three pressures. ? Feel the other breast in the same way. 3. Sit or stand in the tub or shower. 4. With soapy water on your skin, feel each breast the same way you did in step 2 when you were lying on the floor. Write down what you find Writing down what you find can help you remember what to tell your doctor. Write down:  What is normal for each breast.  Any changes you find in each breast, including: ? The kind of changes you find. ? Whether you have pain. ? Size and location of any lumps.  When you last had your menstrual period. General tips  Check your breasts every month.  If you are breastfeeding, the best time to check your breasts is after you feed your baby or after you use a breast pump.  If you get menstrual periods, the best time to check your breasts is 5-7 days after your menstrual period is over.  With time, you will become comfortable with the self-exam, and you will begin to know if there are changes in your breasts. Contact a doctor if you:  See a change in the shape or size of your breasts or nipples.  See a change in the skin of your breast or nipples, such as red or scaly skin.  Have fluid coming from your nipples that is not normal.  Find a lump or thick area that was not there before.  Have pain in your breasts.  Have any concerns about your breast health. Summary  Breast self-awareness includes looking for changes in your breasts, as well as feeling for changes within your breasts.  Breast self-awareness should be done in front of a mirror in a well-lit room.  You should  check your breasts every month. If you get menstrual periods, the best time to check your breasts is 5-7 days after your menstrual period is over.  Let your doctor know of any changes you see in your breasts, including changes in size, changes on the skin, pain or tenderness, or fluid from your nipples that is not normal. This   information is not intended to replace advice given to you by your health care provider. Make sure you discuss any questions you have with your health care provider. Document Released: 01/09/2008 Document Revised: 03/11/2018 Document Reviewed: 03/11/2018 Elsevier Patient Education  2020 [employer]  

## 2019-05-29 ENCOUNTER — Ambulatory Visit
Admission: RE | Admit: 2019-05-29 | Discharge: 2019-05-29 | Disposition: A | Discharge status: Home / Self Care | Payer: 59 | Source: Ambulatory Visit | Attending: Obstetrics and Gynecology | Admitting: Obstetrics and Gynecology

## 2019-05-29 DIAGNOSIS — Z1231 Encounter for screening mammogram for malignant neoplasm of breast: Secondary | ICD-10-CM

## 2020-03-25 ENCOUNTER — Other Ambulatory Visit: Payer: Self-pay | Admitting: Primary Care

## 2020-03-25 DIAGNOSIS — E039 Hypothyroidism, unspecified: Secondary | ICD-10-CM

## 2020-03-29 ENCOUNTER — Other Ambulatory Visit: Payer: Self-pay

## 2020-03-29 ENCOUNTER — Encounter: Payer: Self-pay | Admitting: Primary Care

## 2020-03-29 ENCOUNTER — Ambulatory Visit: Payer: 59 | Admitting: Primary Care

## 2020-03-29 VITALS — BP 122/82 | HR 76 | Temp 96.1°F | Ht 66.0 in | Wt 196.8 lb

## 2020-03-29 DIAGNOSIS — E785 Hyperlipidemia, unspecified: Secondary | ICD-10-CM | POA: Diagnosis not present

## 2020-03-29 DIAGNOSIS — N289 Disorder of kidney and ureter, unspecified: Secondary | ICD-10-CM

## 2020-03-29 DIAGNOSIS — E039 Hypothyroidism, unspecified: Secondary | ICD-10-CM

## 2020-03-29 LAB — CBC
HCT: 41.5 % (ref 36.0–46.0)
Hemoglobin: 13.8 g/dL (ref 12.0–15.0)
MCHC: 33.4 g/dL (ref 30.0–36.0)
MCV: 86.5 fl (ref 78.0–100.0)
Platelets: 185 10*3/uL (ref 150.0–400.0)
RBC: 4.8 Mil/uL (ref 3.87–5.11)
RDW: 14.4 % (ref 11.5–15.5)
WBC: 4.3 10*3/uL (ref 4.0–10.5)

## 2020-03-29 LAB — COMPREHENSIVE METABOLIC PANEL
ALT: 19 U/L (ref 0–35)
AST: 15 U/L (ref 0–37)
Albumin: 4 g/dL (ref 3.5–5.2)
Alkaline Phosphatase: 71 U/L (ref 39–117)
BUN: 12 mg/dL (ref 6–23)
CO2: 26 mEq/L (ref 19–32)
Calcium: 9 mg/dL (ref 8.4–10.5)
Chloride: 104 mEq/L (ref 96–112)
Creatinine, Ser: 1.01 mg/dL (ref 0.40–1.20)
GFR: 59.01 mL/min — ABNORMAL LOW (ref >60.00)
Glucose, Bld: 91 mg/dL (ref 70–99)
Potassium: 4.3 mEq/L (ref 3.5–5.1)
Sodium: 137 mEq/L (ref 135–145)
Total Bilirubin: 0.5 mg/dL (ref 0.2–1.2)
Total Protein: 6.8 g/dL (ref 6.0–8.3)

## 2020-03-29 LAB — LIPID PANEL
Cholesterol: 230 mg/dL — ABNORMAL HIGH (ref 0–200)
HDL: 41.2 mg/dL (ref >39.00)
LDL Cholesterol: 161 mg/dL — ABNORMAL HIGH (ref 0–99)
NonHDL: 188.48
Total CHOL/HDL Ratio: 6
Triglycerides: 135 mg/dL (ref 0.0–149.0)
VLDL: 27 mg/dL (ref 0.0–40.0)

## 2020-03-29 LAB — TSH: TSH: 0.12 u[IU]/mL — ABNORMAL LOW (ref 0.35–4.50)

## 2020-03-29 NOTE — Progress Notes (Addendum)
Subjective:    Patient ID: Judith Oneal, female    DOB: [birth year], 44 y.o.   MRN: [id number]  HPI  This visit occurred during the SARS-CoV-2 public health emergency.  Safety protocols were in place, including screening questions prior to the visit, additional usage of staff PPE, and extensive cleaning of exam room while observing appropriate contact time as indicated for disinfecting solutions.   Judith Oneal is a 44 year old female with a history of hypothyroidism, hyperlipidemia, decreased renal function who presents today for follow up.  1) Hypothyroidism: Currently managed on levothyroxine 112 mcg. Last TSH of 0.62 in August 2020. She is taking her levothyroxine every morning on an empty stomach with water only. No food or other medications for one hour.   2) Hyperlipidemia: No current medication treatment. LDL from 2020 of 156. She is working on her diet by limiting portions, also working on some plant based diet. She is walking 2.5 miles daily, five days weekly.   Wt Readings from Last 3 Encounters:  03/29/20 196 lb 12 oz (89.2 kg)  04/24/19 203 lb 8 oz (92.3 kg)  04/02/19 201 lb 8 oz (91.4 kg)      BP Readings from Last 3 Encounters:  03/29/20 122/82  04/24/19 120/79  04/02/19 122/80    The 10-year ASCVD risk score Mikey Bussing DC Jr., et al., 2013) is: 1.5%   Values used to calculate the score:     Age: 42 years     Sex: Female     Is Non-Hispanic African American: No     Diabetic: No     Tobacco smoker: No     Systolic Blood Pressure: 622 mmHg     Is BP treated: No     HDL Cholesterol: 41.2 mg/dL     Total Cholesterol: 230 mg/dL   Review of Systems  Eyes: Negative for visual disturbance.  Respiratory: Negative for shortness of breath.   Cardiovascular: Negative for chest pain.  Neurological: Negative for dizziness and headaches.       Past Medical History:  Diagnosis Date  . Cardiac arrhythmia due to congenital heart disease   . Chicken pox   . Dyspareunia in  female   . Heart murmur    Mitral valve prolapse  . Hypertension   . Thyroid disease   . Vitamin D deficiency      Social History   Socioeconomic History  . Marital status: Divorced    Spouse name: Not on file  . Number of children: Not on file  . Years of education: Not on file  . Highest education level: Not on file  Occupational History  . Not on file  Tobacco Use  . Smoking status: Never Smoker  . Smokeless tobacco: Never Used  Vaping Use  . Vaping Use: Never used  Substance and Sexual Activity  . Alcohol use: Not Currently    Alcohol/week: 0.0 standard drinks    Comment: Occasional use, socially   . Drug use: No  . Sexual activity: Not Currently    Birth control/protection: I.U.D.  Other Topics Concern  . Not on file  Social History Narrative   Single.   No children.   Works at [employer] as a Freight forwarder.   Enjoys going to the lake, reading.   Social Determinants of Health   Financial Resource Strain:   . Difficulty of Paying Living Expenses: Not on file  Food Insecurity:   . Worried About Charity fundraiser in the Last  Year: Not on file  . Ran Out of Food in the Last Year: Not on file  Transportation Needs:   . Lack of Transportation (Medical): Not on file  . Lack of Transportation (Non-Medical): Not on file  Physical Activity:   . Days of Exercise per Week: Not on file  . Minutes of Exercise per Session: Not on file  Stress:   . Feeling of Stress : Not on file  Social Connections:   . Frequency of Communication with Friends and Family: Not on file  . Frequency of Social Gatherings with Friends and Family: Not on file  . Attends Religious Services: Not on file  . Active Member of Clubs or Organizations: Not on file  . Attends Archivist Meetings: Not on file  . Marital Status: Not on file  Intimate Partner Violence:   . Fear of Current or Ex-Partner: Not on file  . Emotionally Abused: Not on file  . Physically Abused: Not on file  .  Sexually Abused: Not on file    Past Surgical History:  Procedure Laterality Date  . POLYPECTOMY  06/2014   polyp removal from uterus and mirena inserted    Family History  Problem Relation Age of Onset  . Arthritis Mother   . Prostate cancer Father   . Lymphoma Father   . Arthritis Maternal Grandfather   . Hyperlipidemia Maternal Grandfather   . Hypertension Maternal Grandfather   . Kidney cancer Maternal Grandfather   . Breast cancer Other   . Breast cancer Maternal Aunt     No Known Allergies  Current Outpatient Medications on File Prior to Visit  Medication Sig Dispense Refill  . Cholecalciferol (VITAMIN D3) 2000 UNITS TABS Take 2 tablets by mouth daily.    . folic acid (FOLVITE) 333 MCG tablet Take 400 mcg by mouth daily.    Marland Kitchen levothyroxine (SYNTHROID) 112 MCG tablet TAKE 1 TABLET BY MOUTH EVERY MORNING ON AN EMPTY STOMACH WITH WATER ONLY. 90 tablet 0  . Multiple Vitamin (MULTI VITAMIN DAILY PO) Take 1 tablet by mouth daily.    . vitamin C (ASCORBIC ACID) 500 MG tablet Take 500 mg by mouth daily.    . vitamin E 400 UNIT capsule Take 400 Units by mouth daily.     No current facility-administered medications on file prior to visit.    BP 122/82   Pulse 76   Temp (!) 96.1 F (35.6 C) (Temporal)   Ht 5' 6"  (1.676 m)   Wt 196 lb 12 oz (89.2 kg)   SpO2 98%   BMI 31.76 kg/m    Objective:   Physical Exam Cardiovascular:     Rate and Rhythm: Normal rate and regular rhythm.  Pulmonary:     Effort: Pulmonary effort is normal.     Breath sounds: Normal breath sounds.  Musculoskeletal:     Cervical back: Neck supple.  Skin:    General: Skin is warm and dry.            Assessment & Plan:

## 2020-03-29 NOTE — Assessment & Plan Note (Signed)
Slight increase in LDL which is above goal. ASCVD risk score of 1.5%.  Discussed the importance of a healthy diet and regular exercise in order for weight loss, and to reduce the risk of any potential medical problems.  Continue to monitor, and if we see an increase next year then consider statin therapy.

## 2020-03-29 NOTE — Patient Instructions (Signed)
Stop by the lab prior to leaving today. I will notify you of your results once received.   Be sure to take your levothyroxine (thyroid medication) every morning on an empty stomach with water only. No food or other medications for 30 minutes. No heartburn medication, iron pills, calcium, vitamin D, or magnesium pills within four hours of taking levothyroxine.   It was a pleasure to see you today!  

## 2020-03-29 NOTE — Assessment & Plan Note (Addendum)
Improved, still declined overall compared to a few years ago, no recurrent use of NSAID's.  Family history of renal cancer in maternal grandfather, for this reason we will obtain an ultrasound of the kidneys.  Patient to be notified.

## 2020-03-29 NOTE — Assessment & Plan Note (Signed)
Taking levothyroxine correctly. Recent TSH of 0.12 which is abnormal given her stability on 112 mcg over the years.  Will not adjust treatment, will repeat TSH in 6 weeks. Will notify patient.

## 2020-04-05 ENCOUNTER — Other Ambulatory Visit: Payer: Self-pay

## 2020-04-05 ENCOUNTER — Ambulatory Visit
Admission: RE | Admit: 2020-04-05 | Discharge: 2020-04-05 | Disposition: A | Discharge status: Home / Self Care | Payer: 59 | Source: Ambulatory Visit | Attending: Primary Care | Admitting: Primary Care

## 2020-04-05 DIAGNOSIS — N289 Disorder of kidney and ureter, unspecified: Secondary | ICD-10-CM | POA: Diagnosis not present

## 2020-04-05 DIAGNOSIS — R944 Abnormal results of kidney function studies: Secondary | ICD-10-CM | POA: Diagnosis not present

## 2020-04-06 ENCOUNTER — Telehealth: Payer: Self-pay | Admitting: Obstetrics and Gynecology

## 2020-04-21 ENCOUNTER — Other Ambulatory Visit: Payer: Self-pay | Admitting: Primary Care

## 2020-04-21 ENCOUNTER — Other Ambulatory Visit: Payer: Self-pay | Admitting: Obstetrics and Gynecology

## 2020-04-21 DIAGNOSIS — Z1231 Encounter for screening mammogram for malignant neoplasm of breast: Secondary | ICD-10-CM

## 2020-04-27 ENCOUNTER — Other Ambulatory Visit: Payer: Self-pay | Admitting: Primary Care

## 2020-04-27 DIAGNOSIS — E039 Hypothyroidism, unspecified: Secondary | ICD-10-CM

## 2020-04-28 NOTE — Telephone Encounter (Signed)
error 

## 2020-04-29 ENCOUNTER — Encounter: Payer: 59 | Admitting: Obstetrics and Gynecology

## 2020-05-10 ENCOUNTER — Other Ambulatory Visit (INDEPENDENT_AMBULATORY_CARE_PROVIDER_SITE_OTHER): Payer: 59

## 2020-05-10 ENCOUNTER — Other Ambulatory Visit: Payer: Self-pay

## 2020-05-10 ENCOUNTER — Ambulatory Visit: Payer: 59 | Admitting: Primary Care

## 2020-05-10 DIAGNOSIS — E039 Hypothyroidism, unspecified: Secondary | ICD-10-CM

## 2020-05-10 LAB — TSH: TSH: 0.3 u[IU]/mL — ABNORMAL LOW (ref 0.35–4.50)

## 2020-05-12 ENCOUNTER — Other Ambulatory Visit: Payer: Self-pay

## 2020-05-12 DIAGNOSIS — E039 Hypothyroidism, unspecified: Secondary | ICD-10-CM

## 2020-05-12 MED ORDER — LEVOTHYROXINE SODIUM 100 MCG PO TABS: ORAL_TABLET | ORAL | 1 refills | Status: DC

## 2020-05-31 ENCOUNTER — Other Ambulatory Visit: Payer: Self-pay

## 2020-05-31 ENCOUNTER — Ambulatory Visit
Admission: RE | Admit: 2020-05-31 | Discharge: 2020-05-31 | Disposition: A | Discharge status: Home / Self Care | Payer: 59 | Source: Ambulatory Visit | Attending: Primary Care | Admitting: Primary Care

## 2020-05-31 DIAGNOSIS — Z1231 Encounter for screening mammogram for malignant neoplasm of breast: Secondary | ICD-10-CM | POA: Insufficient documentation

## 2020-06-22 ENCOUNTER — Ambulatory Visit (INDEPENDENT_AMBULATORY_CARE_PROVIDER_SITE_OTHER): Payer: 59 | Admitting: Obstetrics and Gynecology

## 2020-06-22 ENCOUNTER — Other Ambulatory Visit: Payer: Self-pay

## 2020-06-22 ENCOUNTER — Encounter: Payer: Self-pay | Admitting: Obstetrics and Gynecology

## 2020-06-22 VITALS — BP 119/86 | HR 71 | Ht 66.0 in | Wt 198.8 lb

## 2020-06-22 DIAGNOSIS — I341 Nonrheumatic mitral (valve) prolapse: Secondary | ICD-10-CM | POA: Diagnosis not present

## 2020-06-22 DIAGNOSIS — Z9889 Other specified postprocedural states: Secondary | ICD-10-CM | POA: Diagnosis not present

## 2020-06-22 DIAGNOSIS — E039 Hypothyroidism, unspecified: Secondary | ICD-10-CM | POA: Diagnosis not present

## 2020-06-22 DIAGNOSIS — E669 Obesity, unspecified: Secondary | ICD-10-CM | POA: Diagnosis not present

## 2020-06-22 DIAGNOSIS — Z01419 Encounter for gynecological examination (general) (routine) without abnormal findings: Secondary | ICD-10-CM | POA: Diagnosis not present

## 2020-06-22 DIAGNOSIS — E785 Hyperlipidemia, unspecified: Secondary | ICD-10-CM

## 2020-06-22 NOTE — Progress Notes (Signed)
GYNECOLOGY ANNUAL PHYSICAL EXAM PROGRESS NOTE  Subjective:    Judith Oneal is a 44 y.o. G0P0000 female who presents for an annual exam. The patient has no complaints today. The patient wears seatbelts: yes. The patient participates in regular exercise: yes (4-5 days per week). Has the patient ever been transfused or tattooed?: no.    Gynecologic History  No LMP recorded (lmp unknown). Patient has had an ablation.  Occasional cycles (~ every 4-5 months).  Notes last 2 cycles were a bit heavier.  Menarche age: 84 Contraception: None  History of STI's: Denies.  Last Pap: 04/18/2018. Results were: NILM, HR HPV+ (but neg for types 16/18/45).  Denies h/o abnormal pap smears. Last mammogram: 05/31/2020.  Results were: normal    OB History  Gravida Para Term Preterm AB Living  0 0 0 0 0 0  SAB TAB Ectopic Multiple Live Births  0 0 0 0 0    Past Medical History:  Diagnosis Date   Cardiac arrhythmia due to congenital heart disease    Chicken pox    Dyspareunia in female    Heart murmur    Mitral valve prolapse   Hypertension    Thyroid disease    Vitamin D deficiency     Past Surgical History:  Procedure Laterality Date   POLYPECTOMY  06/2014   polyp removal from uterus and mirena inserted    Family History  Problem Relation Age of Onset   Arthritis Mother    Prostate cancer Father    Lymphoma Father    Arthritis Maternal Grandfather    Hyperlipidemia Maternal Grandfather    Hypertension Maternal Grandfather    Kidney cancer Maternal Grandfather    Breast cancer Other    Breast cancer Maternal Aunt     Social History   Socioeconomic History   Marital status: Divorced    Spouse name: Not on file   Number of children: Not on file   Years of education: Not on file   Highest education level: Not on file  Occupational History   Not on file  Tobacco Use   Smoking status: Never Smoker   Smokeless tobacco: Never Used  Vaping Use    Vaping Use: Never used  Substance and Sexual Activity   Alcohol use: Not Currently    Alcohol/week: 0.0 standard drinks    Comment: Occasional use, socially    Drug use: No   Sexual activity: Not Currently    Birth control/protection: None  Other Topics Concern   Not on file  Social History Narrative   Single.   No children.   Works at [employer] as a Freight forwarder.   Enjoys going to the lake, reading.   Social Determinants of Health   Financial Resource Strain:    Difficulty of Paying Living Expenses: Not on file  Food Insecurity:    Worried About Charity fundraiser in the Last Year: Not on file   [employer] of Food in the Last Year: Not on file  Transportation Needs:    Lack of Transportation (Medical): Not on file   Lack of Transportation (Non-Medical): Not on file  Physical Activity:    Days of Exercise per Week: Not on file   Minutes of Exercise per Session: Not on file  Stress:    Feeling of Stress : Not on file  Social Connections:    Frequency of Communication with Friends and Family: Not on file   Frequency of Social Gatherings with Friends  and Family: Not on file   Attends Religious Services: Not on file   Active Member of Clubs or Organizations: Not on file   Attends Archivist Meetings: Not on file   Marital Status: Not on file  Intimate Partner Violence:    Fear of Current or Ex-Partner: Not on file   Emotionally Abused: Not on file   Physically Abused: Not on file   Sexually Abused: Not on file    Current Outpatient Medications on File Prior to Visit  Medication Sig Dispense Refill   Cholecalciferol (VITAMIN D3) 2000 UNITS TABS Take 2 tablets by mouth daily.     folic acid (FOLVITE) 782 MCG tablet Take 400 mcg by mouth daily.     levothyroxine (SYNTHROID) 100 MCG tablet TAKE 1 TABLET BY MOUTH EVERY MORNING ON AN EMPTY STOMACH WITH WATER ONLY. 90 tablet 1   Multiple Vitamin (MULTI VITAMIN DAILY PO) Take 1 tablet by mouth  daily.     vitamin C (ASCORBIC ACID) 500 MG tablet Take 500 mg by mouth daily.     vitamin E 400 UNIT capsule Take 400 Units by mouth daily.     No current facility-administered medications on file prior to visit.    No Known Allergies   Review of Systems Constitutional: negative for chills, fatigue, fevers and sweats Eyes: negative for irritation, redness and visual disturbance Ears, nose, mouth, throat, and face: negative for hearing loss, nasal congestion, snoring and tinnitus Respiratory: negative for asthma, cough, sputum Cardiovascular: negative for chest pain, dyspnea, exertional chest pressure/discomfort, irregular heart beat, palpitations and syncope Gastrointestinal: negative for abdominal pain, change in bowel habits, nausea and vomiting Genitourinary: negative for abnormal menstrual periods, genital lesions, sexual problems and vaginal discharge, dysuria and urinary incontinence Integument/breast: negative for breast lump, breast tenderness and nipple discharge Hematologic/lymphatic: negative for bleeding and easy bruising Musculoskeletal:negative for back pain and muscle weakness Neurological: negative for dizziness, headaches, vertigo and weakness Endocrine: negative for diabetic symptoms including polydipsia, polyuria and skin dryness Allergic/Immunologic: negative for hay fever and urticaria     Objective:  Blood pressure 119/86, pulse 71, height 5' 6"  (1.676 m), weight 198 lb 12.8 oz (90.2 kg). Body mass index is 32.09 kg/m.  General Appearance:    Alert, cooperative, no distress, appears stated age, mildly obese  Head:    Normocephalic, without obvious abnormality, atraumatic  Eyes:    PERRL, conjunctiva/corneas clear, EOM's intact, both eyes  Ears:    Normal external ear canals, both ears  Nose:   Nares normal, septum midline, mucosa normal, no drainage or sinus tenderness  Throat:   Lips, mucosa, and tongue normal; teeth and gums normal  Neck:   Supple,  symmetrical, trachea midline, no adenopathy; thyroid: no enlargement/tenderness/nodules; no carotid bruit or JVD  Back:     Symmetric, no curvature, ROM normal, no CVA tenderness  Lungs:     Clear to auscultation bilaterally, respirations unlabored  Chest Wall:    No tenderness or deformity   Heart:    Regular rate and rhythm, S1 and S2 normal, no murmurs, rub or gallop  Breast Exam:    No tenderness, masses, or nipple abnormality  Abdomen:     Soft, non-tender, bowel sounds active all four quadrants, no masses, no organomegaly.    Genitalia:    Pelvic:external genitalia normal, vagina without lesions, discharge, or tenderness, rectovaginal septum  normal. Cervix normal in appearance, no cervical motion tenderness, no adnexal masses or tenderness.  Uterus normal size, shape, mobile, regular  contours, nontender.  Rectal:    Normal external sphincter.  No hemorrhoids appreciated. Internal exam not done.   Extremities:   Extremities normal, atraumatic, no cyanosis or edema  Pulses:   2+ and symmetric all extremities  Skin:   Skin color, texture, turgor normal, no rashes or lesions  Lymph nodes:   Cervical, supraclavicular, and axillary nodes normal  Neurologic:   CNII-XII intact, normal strength, sensation and reflexes throughout   .  Labs:  Lab Results  Component Value Date   WBC 4.3 03/29/2020   HGB 13.8 03/29/2020   HCT 41.5 03/29/2020   MCV 86.5 03/29/2020   PLT 185.0 03/29/2020    Lab Results  Component Value Date   CREATININE 1.01 03/29/2020   BUN 12 03/29/2020   NA 137 03/29/2020   K 4.3 03/29/2020   CL 104 03/29/2020   CO2 26 03/29/2020    Lab Results  Component Value Date   ALT 19 03/29/2020   AST 15 03/29/2020   ALKPHOS 71 03/29/2020   BILITOT 0.5 03/29/2020    Lab Results  Component Value Date   TSH 0.30 (L) 05/10/2020    Lab Results  Component Value Date   CHOL 230 (H) 03/29/2020   HDL 41.20 03/29/2020   LDLCALC 161 (H) 03/29/2020   TRIG 135.0  03/29/2020   CHOLHDL 6 03/29/2020      Assessment:   Encounter for well woman exam with routine gynecological exam Obesity (BMI 30.0-34.9) Mitral valve prolapse Hyperlipidemia, unspecified hyperlipidemia type Acquired hypothyroidism History of endometrial ablation  Plan:    Breast self exam technique reviewed and patient encouraged to perform self-exam monthly. Contraception: None currently. Discussed healthy lifestyle modifications, including diet and exercise. Mammogram up to date. Pap smear up to date.  HR HPV types negative, so can continue q 3 year screening. Due in 1 year.  Hypothyroidism, Dyslipidemia, and MVP managed by PCP. Declines flu vaccine.  COVID vaccination: Wynetta Emery and Wynetta Emery vaccine received in May. Plans for [place] booster when eligible.  History of endometrial ablation - notes occasional cycles, slightly heavier since IUD was removed last year but still manageable.  Follow up in 1 year.     Rubie Maid, MD [facility]

## 2020-06-22 NOTE — Patient Instructions (Signed)
Health Maintenance, Female Adopting a healthy lifestyle and getting preventive care are important in promoting health and wellness. Ask your health care provider about:  The right schedule for you to have regular tests and exams.  Things you can do on your own to prevent diseases and keep yourself healthy. What should I know about diet, weight, and exercise? Eat a healthy diet   Eat a diet that includes plenty of vegetables, fruits, low-fat dairy products, and lean protein.  Do not eat a lot of foods that are high in solid fats, added sugars, or sodium. Maintain a healthy weight Body mass index (BMI) is used to identify weight problems. It estimates body fat based on height and weight. Your health care provider can help determine your BMI and help you achieve or maintain a healthy weight. Get regular exercise Get regular exercise. This is one of the most important things you can do for your health. Most adults should:  Exercise for at least 150 minutes each week. The exercise should increase your heart rate and make you sweat (moderate-intensity exercise).  Do strengthening exercises at least twice a week. This is in addition to the moderate-intensity exercise.  Spend less time sitting. Even light physical activity can be beneficial. Watch cholesterol and blood lipids Have your blood tested for lipids and cholesterol at 46 years of age, then have this test every 5 years. Have your cholesterol levels checked more often if:  Your lipid or cholesterol levels are high.  You are older than 46 years of age.  You are at high risk for heart disease. What should I know about cancer screening? Depending on your health history and family history, you may need to have cancer screening at various ages. This may include screening for:  Breast cancer.  Cervical cancer.  Colorectal cancer.  Skin cancer.  Lung cancer. What should I know about heart disease, diabetes, and high blood  pressure? Blood pressure and heart disease  High blood pressure causes heart disease and increases the risk of stroke. This is more likely to develop in people who have high blood pressure readings, are of African descent, or are overweight.  Have your blood pressure checked: ? Every 3-5 years if you are 18-39 years of age. ? Every year if you are 40 years old or older. Diabetes Have regular diabetes screenings. This checks your fasting blood sugar level. Have the screening done:  Once every three years after age 40 if you are at a normal weight and have a low risk for diabetes.  More often and at a younger age if you are overweight or have a high risk for diabetes. What should I know about preventing infection? Hepatitis B If you have a higher risk for hepatitis B, you should be screened for this virus. Talk with your health care provider to find out if you are at risk for hepatitis B infection. Hepatitis C Testing is recommended for:  Everyone born from 1945 through 1965.  Anyone with known risk factors for hepatitis C. Sexually transmitted infections (STIs)  Get screened for STIs, including gonorrhea and chlamydia, if: ? You are sexually active and are younger than 46 years of age. ? You are older than 46 years of age and your health care provider tells you that you are at risk for this type of infection. ? Your sexual activity has changed since you were last screened, and you are at increased risk for chlamydia or gonorrhea. Ask your health care provider if   you are at risk.  Ask your health care provider about whether you are at high risk for HIV. Your health care provider may recommend a prescription medicine to help prevent HIV infection. If you choose to take medicine to prevent HIV, you should first get tested for HIV. You should then be tested every 3 months for as long as you are taking the medicine. Pregnancy  If you are about to stop having your period (premenopausal) and  you may become pregnant, seek counseling before you get pregnant.  Take 400 to 800 micrograms (mcg) of folic acid every day if you become pregnant.  Ask for birth control (contraception) if you want to prevent pregnancy. Osteoporosis and menopause Osteoporosis is a disease in which the bones lose minerals and strength with aging. This can result in bone fractures. If you are 19 years old or older, or if you are at risk for osteoporosis and fractures, ask your health care provider if you should:  Be screened for bone loss.  Take a calcium or vitamin D supplement to lower your risk of fractures.  Be given hormone replacement therapy (HRT) to treat symptoms of menopause. Follow these instructions at home: Lifestyle  Do not use any products that contain nicotine or tobacco, such as cigarettes, e-cigarettes, and chewing tobacco. If you need help quitting, ask your health care provider.  Do not use street drugs.  Do not share needles.  Ask your health care provider for help if you need support or information about quitting drugs. Alcohol use  Do not drink alcohol if: ? Your health care provider tells you not to drink. ? You are pregnant, may be pregnant, or are planning to become pregnant.  If you drink alcohol: ? Limit how much you use to 0-1 drink a day. ? Limit intake if you are breastfeeding.  Be aware of how much alcohol is in your drink. In the [place] one drink equals one 12 oz bottle of beer (355 mL), one 5 oz glass of wine (148 mL), or one 1 oz glass of hard liquor (44 mL). General instructions  Schedule regular health, dental, and eye exams.  Stay current with your vaccines.  Tell your health care provider if: ? You often feel depressed. ? You have ever been abused or do not feel safe at home. Summary  Adopting a healthy lifestyle and getting preventive care are important in promoting health and wellness.  Follow your health care provider's instructions about healthy  diet, exercising, and getting tested or screened for diseases.  Follow your health care provider's instructions on monitoring your cholesterol and blood pressure. This information is not intended to replace advice given to you by your health care provider. Make sure you discuss any questions you have with your health care provider. Document Revised: 07/16/2018 Document Reviewed: 07/16/2018 Elsevier Patient Education  2020 [place] Breast self-awareness means being familiar with how your breasts look and feel. It involves checking your breasts regularly and reporting any changes to your health care provider. Practicing breast self-awareness is important. Sometimes changes may not be harmful (are benign), but sometimes a change in your breasts can be a sign of a serious medical problem. It is important to learn how to do this procedure correctly so that you can catch problems early, when treatment is more likely to be successful. All women should practice breast self-awareness, including women who have had breast implants. What you need:  A mirror.  A well-lit  room. How to do a breast self-exam A breast self-exam is one way to learn what is normal for your breasts and whether your breasts are changing. To do a breast self-exam: Look for changes  1. Remove all the clothing above your waist. 2. Stand in front of a mirror in a room with good lighting. 3. Put your hands on your hips. 4. Push your hands firmly downward. 5. Compare your breasts in the mirror. Look for differences between them (asymmetry), such as: ? Differences in shape. ? Differences in size. ? Puckers, dips, and bumps in one breast and not the other. 6. Look at each breast for changes in the skin, such as: ? Redness. ? Scaly areas. 7. Look for changes in your nipples, such as: ? Discharge. ? Bleeding. ? Dimpling. ? Redness. ? A change in position. Feel for changes Carefully feel your  breasts for lumps and changes. It is best to do this while lying on your back on the floor, and again while sitting or standing in the tub or shower with soapy water on your skin. Feel each breast in the following way: 1. Place the arm on the side of the breast you are examining above your head. 2. Feel your breast with the other hand. 3. Start in the nipple area and make -inch (2 cm) overlapping circles to feel your breast. Use the pads of your three middle fingers to do this. Apply light pressure, then medium pressure, then firm pressure. The light pressure will allow you to feel the tissue closest to the skin. The medium pressure will allow you to feel the tissue that is a little deeper. The firm pressure will allow you to feel the tissue close to the ribs. 4. Continue the overlapping circles, moving downward over the breast until you feel your ribs below your breast. 5. Move one finger-width toward the center of the body. Continue to use the -inch (2 cm) overlapping circles to feel your breast as you move slowly up toward your collarbone. 6. Continue the up-and-down exam using all three pressures until you reach your armpit.  Write down what you find Writing down what you find can help you remember what to discuss with your health care provider. Write down:  What is normal for each breast.  Any changes that you find in each breast, including: ? The kind of changes you find. ? Any pain or tenderness. ? Size and location of any lumps.  Where you are in your menstrual cycle, if you are still menstruating. General tips and recommendations  Examine your breasts every month.  If you are breastfeeding, the best time to examine your breasts is after a feeding or after using a breast pump.  If you menstruate, the best time to examine your breasts is 5-7 days after your period. Breasts are generally lumpier during menstrual periods, and it may be more difficult to notice changes.  With time  and practice, you will become more familiar with the variations in your breasts and more comfortable with the exam. Contact a health care provider if you:  See a change in the shape or size of your breasts or nipples.  See a change in the skin of your breast or nipples, such as a reddened or scaly area.  Have unusual discharge from your nipples.  Find a lump or thick area that was not there before.  Have pain in your breasts.  Have any concerns related to your breast health. Summary  Breast  self-awareness includes looking for physical changes in your breasts, as well as feeling for any changes within your breasts.  Breast self-awareness should be performed in front of a mirror in a well-lit room.  You should examine your breasts every month. If you menstruate, the best time to examine your breasts is 5-7 days after your menstrual period.  Let your health care provider know of any changes you notice in your breasts, including changes in size, changes on the skin, pain or tenderness, or unusual fluid from your nipples. This information is not intended to replace advice given to you by your health care provider. Make sure you discuss any questions you have with your health care provider. Document Revised: 03/11/2018 Document Reviewed: 03/11/2018 Elsevier Patient Education  [place].

## 2020-06-22 NOTE — Progress Notes (Signed)
Pt present for annual exam. Pt stated that she was doing well, declined flu vaccine. Pap and mammogram up to date.

## 2020-11-04 ENCOUNTER — Other Ambulatory Visit: Payer: Self-pay | Admitting: Primary Care

## 2020-11-04 DIAGNOSIS — E039 Hypothyroidism, unspecified: Secondary | ICD-10-CM

## 2021-03-28 ENCOUNTER — Other Ambulatory Visit: Payer: Self-pay

## 2021-03-28 ENCOUNTER — Encounter: Payer: Self-pay | Admitting: Primary Care

## 2021-03-28 ENCOUNTER — Ambulatory Visit: Payer: 59 | Admitting: Primary Care

## 2021-03-28 VITALS — BP 120/84 | HR 72 | Temp 98.6°F | Ht 66.0 in | Wt 202.0 lb

## 2021-03-28 DIAGNOSIS — N289 Disorder of kidney and ureter, unspecified: Secondary | ICD-10-CM | POA: Diagnosis not present

## 2021-03-28 DIAGNOSIS — Z23 Encounter for immunization: Secondary | ICD-10-CM

## 2021-03-28 DIAGNOSIS — E039 Hypothyroidism, unspecified: Secondary | ICD-10-CM | POA: Diagnosis not present

## 2021-03-28 DIAGNOSIS — R5382 Chronic fatigue, unspecified: Secondary | ICD-10-CM

## 2021-03-28 DIAGNOSIS — G8929 Other chronic pain: Secondary | ICD-10-CM

## 2021-03-28 DIAGNOSIS — M79605 Pain in left leg: Secondary | ICD-10-CM

## 2021-03-28 DIAGNOSIS — I341 Nonrheumatic mitral (valve) prolapse: Secondary | ICD-10-CM

## 2021-03-28 DIAGNOSIS — Z114 Encounter for screening for human immunodeficiency virus [HIV]: Secondary | ICD-10-CM | POA: Diagnosis not present

## 2021-03-28 DIAGNOSIS — Z1159 Encounter for screening for other viral diseases: Secondary | ICD-10-CM | POA: Diagnosis not present

## 2021-03-28 DIAGNOSIS — R69 Illness, unspecified: Secondary | ICD-10-CM | POA: Diagnosis not present

## 2021-03-28 DIAGNOSIS — E785 Hyperlipidemia, unspecified: Secondary | ICD-10-CM

## 2021-03-28 HISTORY — DX: Other chronic pain: G89.29

## 2021-03-28 LAB — LIPID PANEL
Cholesterol: 225 mg/dL — ABNORMAL HIGH (ref 0–200)
HDL: 49 mg/dL (ref >39.00)
LDL Cholesterol: 148 mg/dL — ABNORMAL HIGH (ref 0–99)
NonHDL: 176.48
Total CHOL/HDL Ratio: 5
Triglycerides: 143 mg/dL (ref 0.0–149.0)
VLDL: 28.6 mg/dL (ref 0.0–40.0)

## 2021-03-28 LAB — COMPREHENSIVE METABOLIC PANEL
ALT: 20 U/L (ref 0–35)
AST: 18 U/L (ref 0–37)
Albumin: 4 g/dL (ref 3.5–5.2)
Alkaline Phosphatase: 86 U/L (ref 39–117)
BUN: 12 mg/dL (ref 6–23)
CO2: 28 mEq/L (ref 19–32)
Calcium: 9.4 mg/dL (ref 8.4–10.5)
Chloride: 104 mEq/L (ref 96–112)
Creatinine, Ser: 0.99 mg/dL (ref 0.40–1.20)
GFR: 68.15 mL/min (ref >60.00)
Glucose, Bld: 98 mg/dL (ref 70–99)
Potassium: 4.7 mEq/L (ref 3.5–5.1)
Sodium: 138 mEq/L (ref 135–145)
Total Bilirubin: 0.5 mg/dL (ref 0.2–1.2)
Total Protein: 6.9 g/dL (ref 6.0–8.3)

## 2021-03-28 LAB — CBC
HCT: 42.2 % (ref 36.0–46.0)
Hemoglobin: 13.9 g/dL (ref 12.0–15.0)
MCHC: 32.8 g/dL (ref 30.0–36.0)
MCV: 86.1 fl (ref 78.0–100.0)
Platelets: 194 10*3/uL (ref 150.0–400.0)
RBC: 4.9 Mil/uL (ref 3.87–5.11)
RDW: 14.4 % (ref 11.5–15.5)
WBC: 5.3 10*3/uL (ref 4.0–10.5)

## 2021-03-28 LAB — VITAMIN D 25 HYDROXY (VIT D DEFICIENCY, FRACTURES): VITD: 33.44 ng/mL (ref 30.00–100.00)

## 2021-03-28 LAB — TSH: TSH: 0.64 u[IU]/mL (ref 0.35–5.50)

## 2021-03-28 LAB — VITAMIN B12: Vitamin B-12: 346 pg/mL (ref 211–911)

## 2021-03-28 NOTE — Assessment & Plan Note (Signed)
Since dose reduction of levothyroxine to 100 mcg, no recent TSH on file.  Repeat TSH pending. Adding CBC and CMP.

## 2021-03-28 NOTE — Assessment & Plan Note (Signed)
Chronic, due for repeat echocardiogram in 1-2 years. Discussed with patient today. No murmur noted on exam today. No other symptoms.

## 2021-03-28 NOTE — Patient Instructions (Addendum)
Stop by the lab prior to leaving today. I will notify you of your results once received.   Stop by [facility] for your xray.  You will be contacted regarding your referral to endocrinology.  Please let us know if you have not been contacted within two weeks.  It was a pleasure to see you today!    Tdap (Tetanus, Diphtheria, Pertussis) Vaccine: What You Need to Know 1. Why get vaccinated? Tdap vaccine can prevent tetanus, diphtheria, and pertussis. Diphtheria and pertussis spread from person to person. Tetanus enters the body through cuts or wounds. TETANUS (T) causes painful stiffening of the muscles. Tetanus can lead to serious health problems, including being unable to open the mouth, having trouble swallowing and breathing, or death. DIPHTHERIA (D) can lead to difficulty breathing, heart failure, paralysis, or death. PERTUSSIS (aP), also known as "whooping cough," can cause uncontrollable, violent coughing that makes it hard to breathe, eat, or drink. Pertussis can be extremely serious especially in babies and young children, causing pneumonia, convulsions, brain damage, or death. In teens and adults, it can cause weight loss, loss of bladder control, passing out, and rib fractures from severe coughing. 2. Tdap vaccine Tdap is only for children 7 years and older, adolescents, and adults.  Adolescents should receive a single dose of Tdap, preferably at age 35 or 57 years. Pregnant people should get a dose of Tdap during every pregnancy, preferably during the early part of the third trimester, to help protect the newborn from pertussis. Infants are most at risk for severe, life-threatening complications frompertussis. Adults who have never received Tdap should get a dose of Tdap. Also, adults should receive a booster dose of either Tdap or Td (a different vaccine that protects against tetanus and diphtheria but not pertussis) every 10 years, or after 5 years in the case of a severe or dirty wound or  burn. Tdap may be given at the same time as other vaccines. 3. Talk with your health care provider Tell your vaccine provider if the person getting the vaccine: Has had an allergic reaction after a previous dose of any vaccine that protects against tetanus, diphtheria, or pertussis, or has any severe, life-threatening allergies Has had a coma, decreased level of consciousness, or prolonged seizures within 7 days after a previous dose of any pertussis vaccine (DTP, DTaP, or Tdap) Has seizures or another nervous system problem Has ever had Guillain-Barr Syndrome (also called "GBS") Has had severe pain or swelling after a previous dose of any vaccine that protects against tetanus or diphtheria In some cases, your health care provider may decide to postpone Tdapvaccination until a future visit. People with minor illnesses, such as a cold, may be vaccinated. People who are moderately or severely ill should usually wait until they recover beforegetting Tdap vaccine.  Your health care provider can give you more information. 4. Risks of a vaccine reaction Pain, redness, or swelling where the shot was given, mild fever, headache, feeling tired, and nausea, vomiting, diarrhea, or stomachache sometimes happen after Tdap vaccination. People sometimes faint after medical procedures, including vaccination. Tellyour provider if you feel dizzy or have vision changes or ringing in the ears.  As with any medicine, there is a very remote chance of a vaccine causing asevere allergic reaction, other serious injury, or death. 5. What if there is a serious problem? An allergic reaction could occur after the vaccinated person leaves the clinic. If you see signs of a severe allergic reaction (hives, swelling of the face  and throat, difficulty breathing, a fast heartbeat, dizziness, or weakness), call [id number] get the person to the nearest hospital. For other signs that concern you, call your health care provider.   Adverse reactions should be reported to the Vaccine Adverse Event Reporting System (VAERS). Your health care provider will usually file this report, or you can do it yourself. Visit the VAERS website at www.vaers.SamedayNews.es or call (908)120-8105. VAERS is only for reporting reactions, and VAERS staff members do not give medical advice. 6. The National Vaccine Injury Compensation Program The [employer] Vaccine Injury Compensation Program (VICP) is a federal program that was created to compensate people who may have been injured by certain vaccines. Claims regarding alleged injury or death due to vaccination have a time limit for filing, which may be as short as two years. Visit the VICP website at GoldCloset.com.ee or call 671-217-5250to learn about the program and about filing a claim. 7. How can I learn more? Ask your health care provider. Call your local or state health department. Visit the website of the Food and Drug Administration (FDA) for vaccine package inserts and additional information at TraderRating.uy. Contact the Centers for Disease Control and Prevention (CDC): Call 703-404-3121 (1-800-CDC-INFO) or Visit CDC's website at http://hunter.com/. Vaccine Information Statement Tdap (Tetanus, Diphtheria, Pertussis) Vaccine(03/11/2020) This information is not intended to replace advice given to you by your health care provider. Make sure you discuss any questions you have with your healthcare provider. Document Revised: 04/06/2020 Document Reviewed: 04/06/2020 Elsevier Patient Education  2022 [employer].

## 2021-03-28 NOTE — Assessment & Plan Note (Signed)
Discussed the importance of a healthy diet and regular exercise in order for weight loss, and to reduce the risk of further co-morbidity.  Repeat lipid panel pending.

## 2021-03-28 NOTE — Addendum Note (Signed)
Addended by: Francella Solian on: 03/28/2021 03:38 PM   Modules accepted: Orders

## 2021-03-28 NOTE — Assessment & Plan Note (Signed)
Renal ultrasound from August 2021 negative. Repeat renal function pending.  She is drinking plenty of water, no recurrent NSAID use.

## 2021-03-28 NOTE — Assessment & Plan Note (Signed)
No recent TSH on file. No recheck since October 2021 when dose reduced from 112 mcg to 100 mcg.   She is taking levothyroxine 100 mcg correctly. Repeat TSH pending.   Referral placed to endocrinology per patient request.

## 2021-03-28 NOTE — Progress Notes (Signed)
Subjective:    Patient ID: Judith Oneal, female    DOB: [birth year], 45 y.o.   MRN: [id number]  HPI  Judith Oneal is a very pleasant 45 y.o. female with a history of hypothyroidism, hyperlipidemia, decreased renal function who presents today for follow up and updated labs, also to discuss chronic leg pain. She is also due for Tetanus vaccine.   Currently managed on levothyroxine 100 mcg. She is taking this every morning on an empty stomach with water only. No food or other medications for 30 minutes. She would like to be referred to an endocrinologist. Since her dose was reduced from 112 to 100 due to TSH of 0.12 and then 0.30 6 weeks. Since then she endorses chronic fatigue and weight gain. Has difficulty losing weight.   Family history of renal cancer in maternal grandfather. Personal history of decreased renal function. Ultrasound from August 2021 without abnormality.   History of mitral valve prolapse, saw cardiology about 8 years ago who completed echocardiogram and mentioned to recheck echo cardiogram in 10 years as "backflow was minimal". She denies increased exertional dyspnea, lower extremity edema, palpitations.   Chronic pain to the left anterior lower extremity in the same location since childhood. Was told previously that she had shin splints. Symptoms will wax and wane, increased with walking an incline or increase cardio. She speed walks and does well. She denies radiation of pain. During childhood another child slid into her left lower extremity at the site of her pain with her cleat while playing softball, otherwise no injury.   BP Readings from Last 3 Encounters:  03/28/21 120/84  06/22/20 119/86  03/29/20 122/82      Review of Systems  Constitutional:  Positive for fatigue.  Respiratory:  Negative for shortness of breath.   Cardiovascular:  Negative for chest pain.  Musculoskeletal:  Negative for joint swelling.       Chronic left lower extremity pain  Skin:   Negative for color change.        Past Medical History:  Diagnosis Date   Cardiac arrhythmia due to congenital heart disease    Chicken pox    Dyspareunia in female    Heart murmur    Mitral valve prolapse   Hypertension    Thyroid disease    Vitamin D deficiency     Social History   Socioeconomic History   Marital status: Divorced    Spouse name: Not on file   Number of children: Not on file   Years of education: Not on file   Highest education level: Not on file  Occupational History   Not on file  Tobacco Use   Smoking status: Never   Smokeless tobacco: Never  Vaping Use   Vaping Use: Never used  Substance and Sexual Activity   Alcohol use: Not Currently    Alcohol/week: 0.0 standard drinks    Comment: Occasional use, socially    Drug use: No   Sexual activity: Not Currently    Birth control/protection: None  Other Topics Concern   Not on file  Social History Narrative   Single.   No children.   Works at [employer] as a Freight forwarder.   [place]jo[place] lake, reading.   Social Determinants of Health   Financial Resource Strain: Not on file  Food Insecurity: Not on file  Transportation Needs: Not on file  Physical Activity: Not on file  Stress: Not on file  Social Connections: Not on file  Intimate Partner Violence: Not on file    Past Surgical History:  Procedure Laterality Date   POLYPECTOMY  06/2014   polyp removal from uterus and mirena inserted    Family History  Problem Relation Age of Onset   Arthritis Mother    Prostate cancer Father    Lymphoma Father    Arthritis Maternal Grandfather    Hyperlipidemia Maternal Grandfather    Hypertension Maternal Grandfather    Kidney cancer Maternal Grandfather    Breast cancer Other    Breast cancer Maternal Aunt     No Known Allergies  Current Outpatient Medications on File Prior to Visit  Medication Sig Dispense Refill   Cholecalciferol (VITAMIN D3) 2000 UNITS TABS Take 2 tablets by  mouth daily.     Cyanocobalamin (B-12) 1000 MCG CAPS      folic acid (FOLVITE) 031 MCG tablet Take 400 mcg by mouth daily.     levothyroxine (SYNTHROID) 100 MCG tablet TAKE 1 TABLET BY MOUTH EVERY MORNING ON AN EMPTY STOMACH WITH WATER ONLY. 90 tablet 1   Multiple Vitamin (MULTI VITAMIN DAILY PO) Take 1 tablet by mouth daily.     Omega-3 Fatty Acids (FISH OIL) 1000 MG CAPS      Psyllium (METAMUCIL) 0.36 g CAPS      Zinc 100 MG TABS      No current facility-administered medications on file prior to visit.    BP 120/84   Pulse 72   Temp 98.6 F (37 C) (Temporal)   Ht 5' 6"  (1.676 m)   Wt 202 lb (91.6 kg)   SpO2 97%   BMI 32.60 kg/m  Objective:   Physical Exam Cardiovascular:     Rate and Rhythm: Normal rate and regular rhythm.  Pulmonary:     Effort: Pulmonary effort is normal.     Breath sounds: Normal breath sounds.  Musculoskeletal:     Cervical back: Neck supple.     Right lower leg: No swelling, deformity, tenderness or bony tenderness. No edema.     Left lower leg: No swelling, deformity, tenderness or bony tenderness. No edema.       Legs:     Comments: Non tender. Overall no significant deformity noted. Normal ambulation.   Skin:    General: Skin is warm and dry.          Assessment & Plan:      This visit occurred during the SARS-CoV-2 public health emergency.  Safety protocols were in place, including screening questions prior to the visit, additional usage of staff PPE, and extensive cleaning of exam room while observing appropriate contact time as indicated for disinfecting solutions.

## 2021-03-28 NOTE — Assessment & Plan Note (Signed)
Chronic since childhood.  Start with xrays. Consider PT.  She agrees.   No abnormality on exam.

## 2021-03-29 ENCOUNTER — Ambulatory Visit
Admission: RE | Admit: 2021-03-29 | Discharge: 2021-03-29 | Disposition: A | Discharge status: Home / Self Care | Payer: 59 | Attending: Internal Medicine | Admitting: Internal Medicine

## 2021-03-29 ENCOUNTER — Ambulatory Visit
Admission: RE | Admit: 2021-03-29 | Discharge: 2021-03-29 | Disposition: A | Discharge status: Home / Self Care | Payer: 59 | Source: Ambulatory Visit | Attending: Primary Care | Admitting: Primary Care

## 2021-03-29 DIAGNOSIS — G8929 Other chronic pain: Secondary | ICD-10-CM

## 2021-03-29 DIAGNOSIS — M79605 Pain in left leg: Secondary | ICD-10-CM | POA: Insufficient documentation

## 2021-03-29 LAB — HEPATITIS C ANTIBODY
Hepatitis C Ab: NONREACTIVE
SIGNAL TO CUT-OFF: 0.01 (ref <1.00)

## 2021-03-29 LAB — HIV ANTIBODY (ROUTINE TESTING W REFLEX): HIV 1&2 Ab, 4th Generation: NONREACTIVE

## 2021-04-25 ENCOUNTER — Other Ambulatory Visit: Payer: Self-pay | Admitting: Primary Care

## 2021-04-25 DIAGNOSIS — E039 Hypothyroidism, unspecified: Secondary | ICD-10-CM

## 2021-05-12 IMAGING — MG DIGITAL SCREENING BILAT W/ TOMO W/ CAD
8 series · 8 of 24 positions shown · non-contrast
Comparison: Previous exam(s).

CLINICAL DATA: Screening.

EXAM:
DIGITAL SCREENING BILATERAL MAMMOGRAM WITH TOMO AND CAD

[R CC synth-2D]
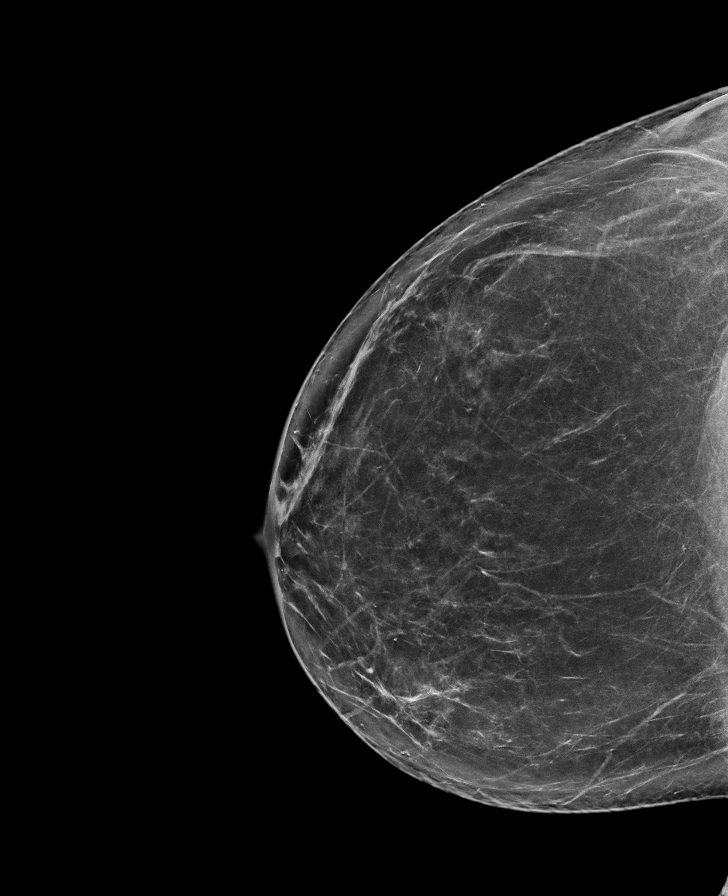

[L CC synth-2D]
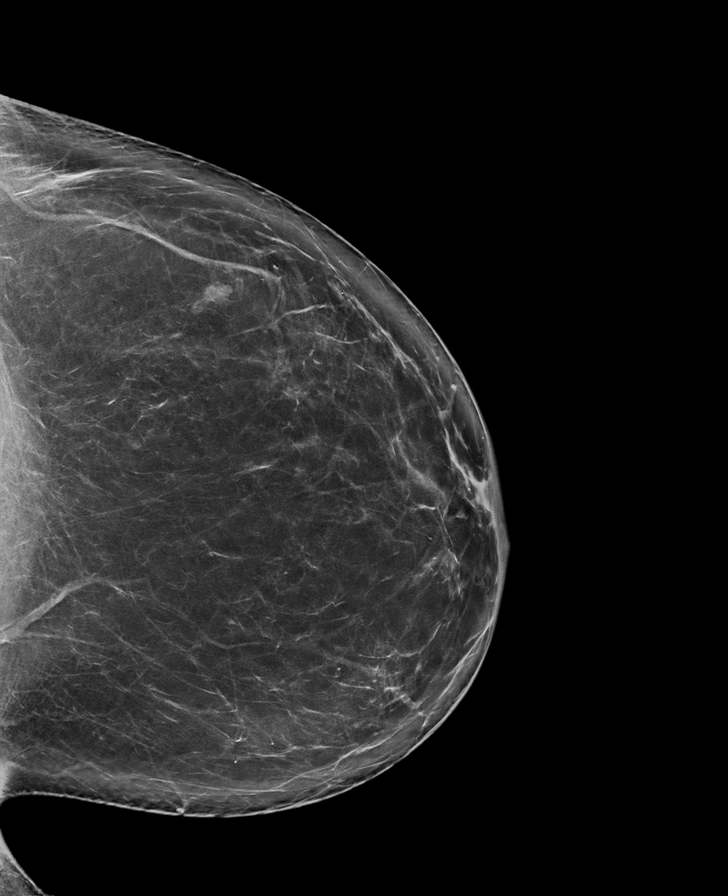

[L MLO synth-2D]
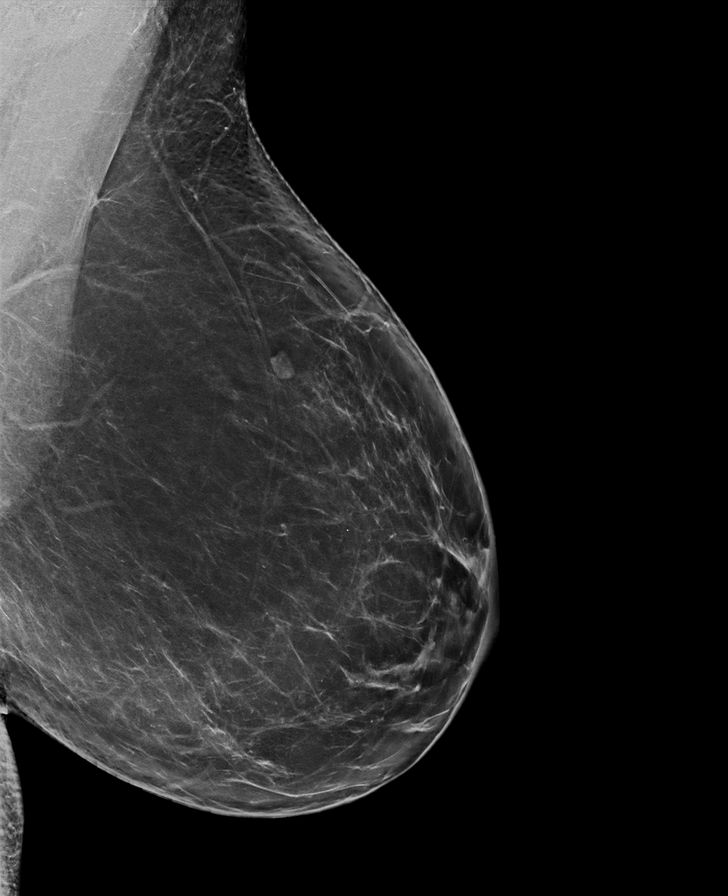

[R MLO synth-2D]
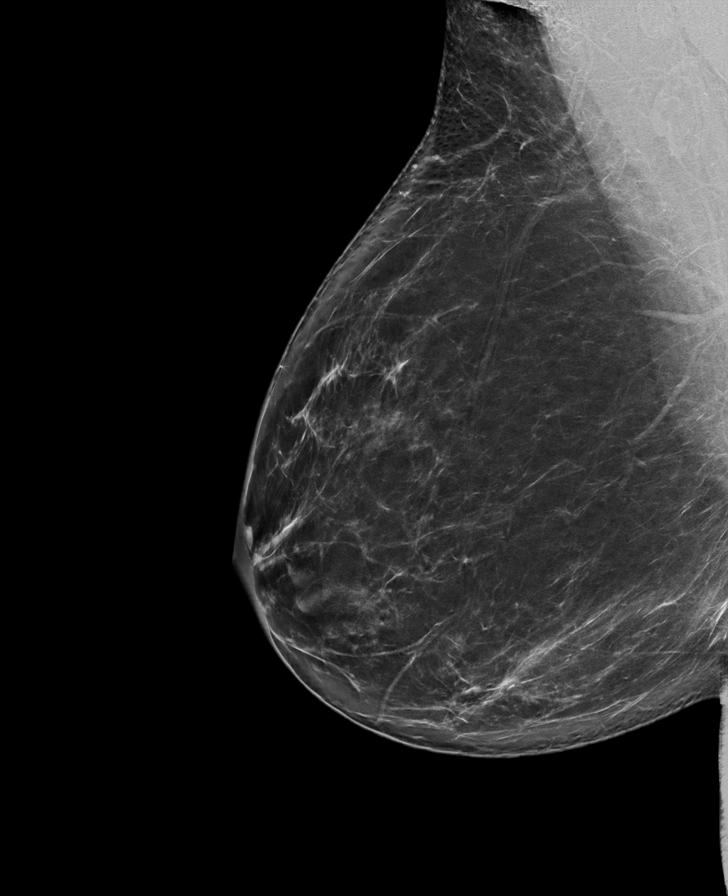

[L CC tomo · tomo slice 45/90.0]
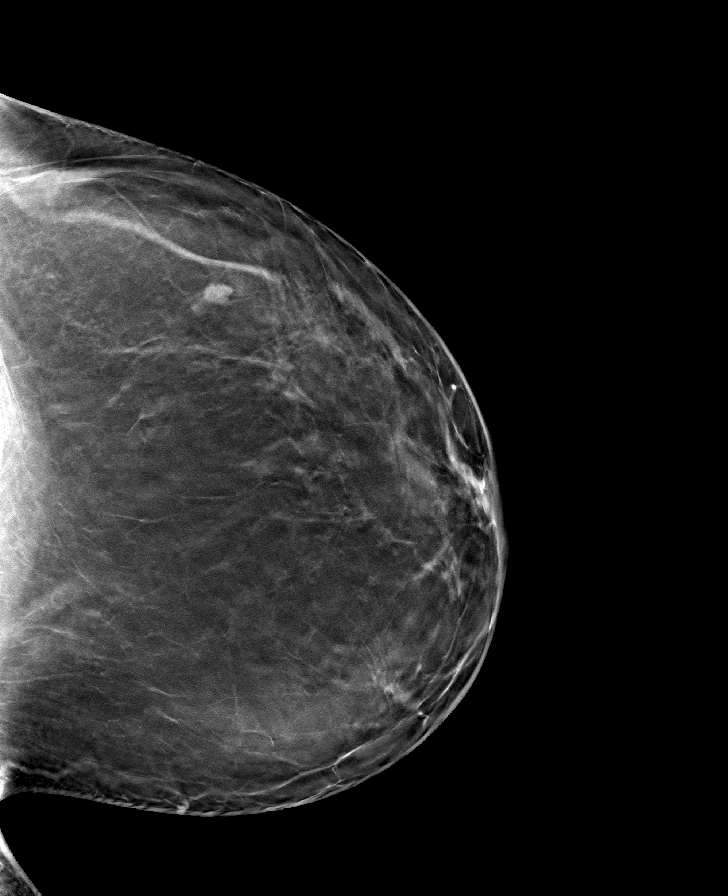

[R CC tomo · tomo slice 45/89.0]
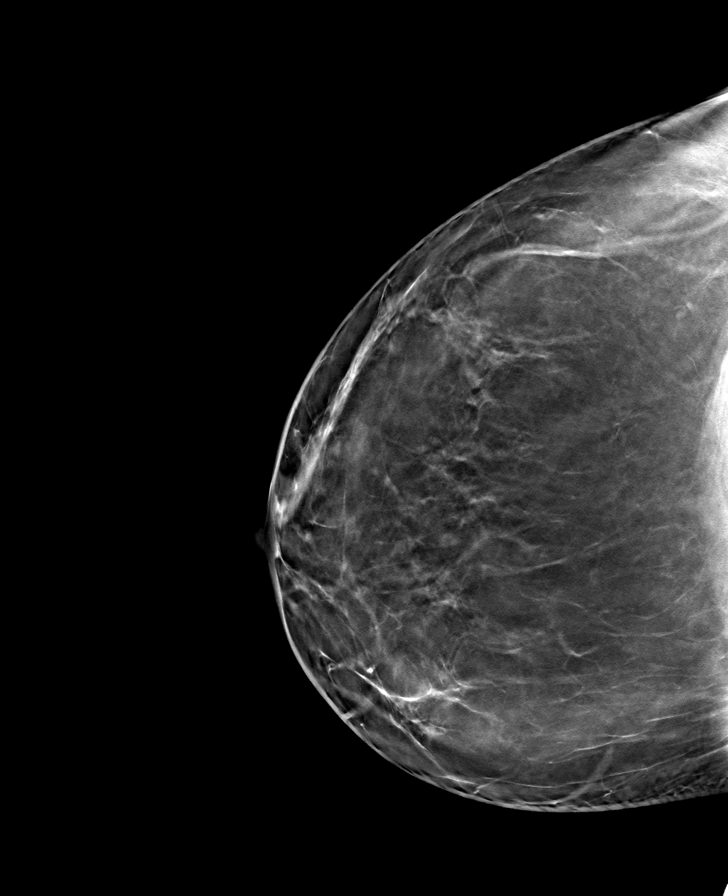

[L MLO tomo · tomo slice 51/100.0]
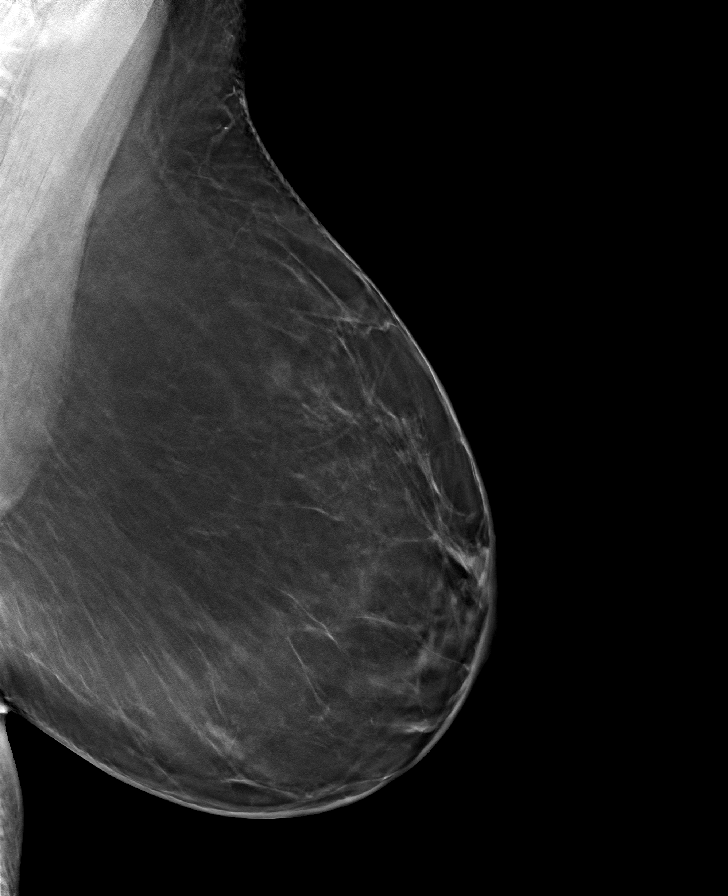

[R MLO tomo · tomo slice 48/95.0]
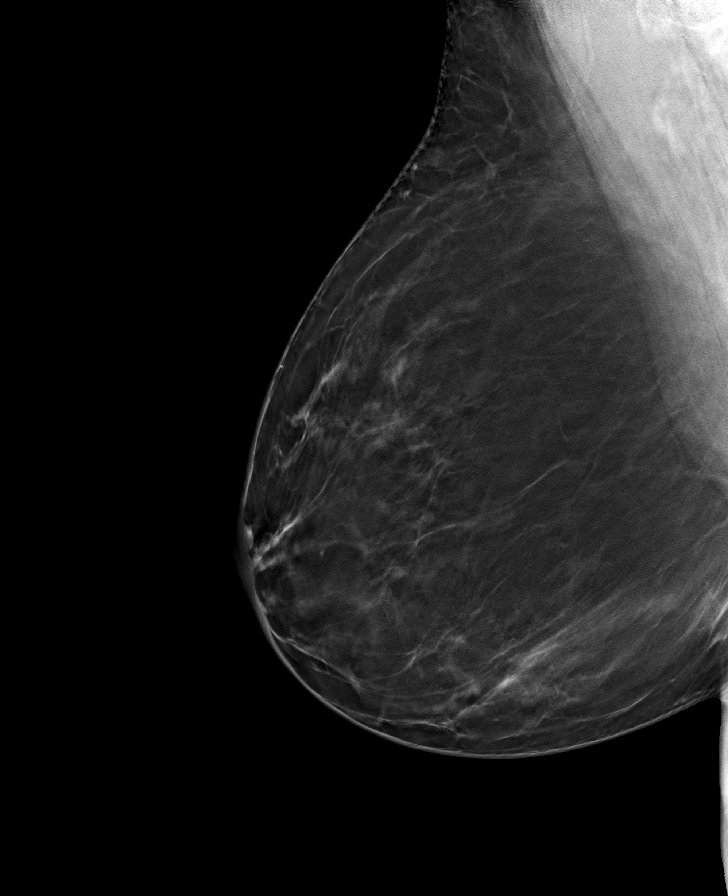

[8 of 24 positions shown; findings below may reference images not displayed]

ACR Breast Density Category b: There are scattered areas of
fibroglandular density.
FINDINGS: There are no findings suspicious for malignancy. Images were
processed with CAD.
IMPRESSION: No mammographic evidence of malignancy. A result letter of this
screening mammogram will be mailed directly to the patient.

RECOMMENDATION:
Screening mammogram in one year. (Code:[id number])

BI-RADS CATEGORY  1: Negative.

## 2021-06-28 ENCOUNTER — Encounter: Payer: 59 | Admitting: Obstetrics and Gynecology

## 2021-07-11 NOTE — Progress Notes (Signed)
GYNECOLOGY ANNUAL PHYSICAL EXAM PROGRESS NOTE  Subjective:    Judith Oneal is a 45 y.o. G0P0000 female who presents for an annual exam. The patient has no complaints today. The patient is sexually active. The patient participates in regular exercise: yes. Has the patient ever been transfused or tattooed?: no. The patient reports that there is not domestic violence in her life.    Gynecologic History:  Menarche age: 28 No LMP recorded. Patient has had an ablation. Occasional cycles (~ every 4-5 months).  Notes last 2 cycles were a bit heavier.  Contraception: none History of STI's: Denies Last Pap:04/18/2018. Results were: NILM, HR HPV+ (but neg for types 16/18/45).  Denies h/o abnormal pap smears. Last mammogram: 05/31/2020. Results were: normal    OB History  Gravida Para Term Preterm AB Living  0 0 0 0 0 0  SAB IAB Ectopic Multiple Live Births  0 0 0 0 0    Past Medical History:  Diagnosis Date   Cardiac arrhythmia due to congenital heart disease    Chicken pox    Dyspareunia in female    Heart murmur    Mitral valve prolapse   Hypertension    Thyroid disease    Vitamin D deficiency     Past Surgical History:  Procedure Laterality Date   POLYPECTOMY  06/2014   polyp removal from uterus and mirena inserted    Family History  Problem Relation Age of Onset   Arthritis Mother    Prostate cancer Father    Lymphoma Father    Arthritis Maternal Grandfather    Hyperlipidemia Maternal Grandfather    Hypertension Maternal Grandfather    Kidney cancer Maternal Grandfather    Breast cancer Other    Breast cancer Maternal Aunt     Social History   Socioeconomic History   Marital status: Divorced    Spouse name: Not on file   Number of children: Not on file   Years of education: Not on file   Highest education level: Not on file  Occupational History   Not on file  Tobacco Use   Smoking status: Never   Smokeless tobacco: Never  Vaping Use   Vaping Use:  Never used  Substance and Sexual Activity   Alcohol use: Not Currently    Alcohol/week: 0.0 standard drinks    Comment: Occasional use, socially    Drug use: No   Sexual activity: Not Currently    Birth control/protection: None  Other Topics Concern   Not on file  Social History Narrative   Single.   No children.   Works at [employer] as a Freight forwarder.   [place] lake, reading.   Social Determinants of Health   Financial Resource Strain: Not on file  Food Insecurity: Not on file  Transportation Needs: Not on file  Physical Activity: Not on file  Stress: Not on file  Social Connections: Not on file  Intimate Partner Violence: Not on file    Current Outpatient Medications on File Prior to Visit  Medication Sig Dispense Refill   Cholecalciferol (VITAMIN D3) 2000 UNITS TABS Take 2 tablets by mouth daily.     Cyanocobalamin (B-12) 1000 MCG CAPS      folic acid (FOLVITE) 387 MCG tablet Take 400 mcg by mouth daily.     levothyroxine (SYNTHROID) 100 MCG tablet Take 1 tablet by mouth every morning on an empty stomach with water only.  No food or other medications for 30  minutes. 90 tablet 3   Multiple Vitamin (MULTI VITAMIN DAILY PO) Take 1 tablet by mouth daily.     Omega-3 Fatty Acids (FISH OIL) 1000 MG CAPS      Psyllium (METAMUCIL) 0.36 g CAPS      Zinc 100 MG TABS      No current facility-administered medications on file prior to visit.    No Known Allergies   Review of Systems Constitutional: negative for chills, fatigue, fevers and sweats Eyes: negative for irritation, redness and visual disturbance Ears, nose, mouth, throat, and face: negative for hearing loss, nasal congestion, snoring and tinnitus Respiratory: negative for asthma, cough, sputum Cardiovascular: negative for chest pain, dyspnea, exertional chest pressure/discomfort, irregular heart beat, palpitations and syncope Gastrointestinal: negative for abdominal pain, change in bowel habits, nausea  and vomiting Genitourinary: negative for abnormal menstrual periods, genital lesions, sexual problems and vaginal discharge, dysuria and urinary incontinence Integument/breast: negative for breast lump, breast tenderness and nipple discharge Hematologic/lymphatic: negative for bleeding and easy bruising Musculoskeletal:negative for back pain and muscle weakness Neurological: negative for dizziness, headaches, vertigo and weakness Endocrine: negative for diabetic symptoms including polydipsia, polyuria and skin dryness Allergic/Immunologic: negative for hay fever and urticaria      Objective:  Blood pressure 115/82, pulse (!) 57, height 5' 6"  (1.676 m), weight 198 lb (89.8 kg), last menstrual period 04/06/2021.  Body mass index is 31.96 kg/m.  General Appearance:    Alert, cooperative, no distress, appears stated age, mild obesity  Head:    Normocephalic, without obvious abnormality, atraumatic  Eyes:    PERRL, conjunctiva/corneas clear, EOM's intact, both eyes  Ears:    Normal external ear canals, both ears  Nose:   Nares normal, septum midline, mucosa normal, no drainage or sinus tenderness  Throat:   Lips, mucosa, and tongue normal; teeth and gums normal  Neck:   Supple, symmetrical, trachea midline, no adenopathy; thyroid: no enlargement/tenderness/nodules; no carotid bruit or JVD  Back:     Symmetric, no curvature, ROM normal, no CVA tenderness  Lungs:     Clear to auscultation bilaterally, respirations unlabored  Chest Wall:    No tenderness or deformity   Heart:    Regular rate and rhythm, S1 and S2 normal, no murmur, rub or gallop  Breast Exam:    No tenderness, masses, or nipple abnormality  Abdomen:     Soft, non-tender, bowel sounds active all four quadrants, no masses, no organomegaly.    Genitalia:    Pelvic:external genitalia normal, vagina without lesions, discharge, or tenderness, rectovaginal septum  normal. Cervix normal in appearance, no cervical motion tenderness, no  adnexal masses or tenderness.  Uterus normal size, shape, mobile, regular contours, nontender.  Rectal:    Normal external sphincter.  No hemorrhoids appreciated. Internal exam not done.   Extremities:   Extremities normal, atraumatic, no cyanosis or edema  Pulses:   2+ and symmetric all extremities  Skin:   Skin color, texture, turgor normal, no rashes or lesions  Lymph nodes:   Cervical, supraclavicular, and axillary nodes normal  Neurologic:   CNII-XII intact, normal strength, sensation and reflexes throughout   .  Labs:  Lab Results  Component Value Date   WBC 5.3 03/28/2021   HGB 13.9 03/28/2021   HCT 42.2 03/28/2021   MCV 86.1 03/28/2021   PLT 194.0 03/28/2021    Lab Results  Component Value Date   CREATININE 0.99 03/28/2021   BUN 12 03/28/2021   NA 138 03/28/2021   K 4.7  03/28/2021   CL 104 03/28/2021   CO2 28 03/28/2021    Lab Results  Component Value Date   ALT 20 03/28/2021   AST 18 03/28/2021   ALKPHOS 86 03/28/2021   BILITOT 0.5 03/28/2021    Lab Results  Component Value Date   TSH 0.64 03/28/2021     Assessment:   1. Encounter for well woman exam with routine gynecological exam   2. Cervical cancer screening   3. Obesity (BMI 30.0-34.9)   4. Hyperlipidemia, unspecified hyperlipidemia type   5. Acquired hypothyroidism   6. History of endometrial ablation   7. Colon cancer screening      Plan:  Blood tests: None ordered. Plans to see an Endocrinologist in a few weeks.  Also gets labs with PCP. Breast self exam technique reviewed and patient encouraged to perform self-exam monthly. Contraception: none. Discussed healthy lifestyle modifications. Mammogram discussed, plans to to get in January. Pap smear ordered. Colon screening: discussed options of Cologuard vs colonoscopy. Will do Cologuard.   COVID vaccination status: has completed series plus 1 booster Flu vaccine: declines Follow up in 1 year for annual exam   Rubie Maid,  MD [facility]

## 2021-07-11 NOTE — Patient Instructions (Signed)
Preventive Care 92-47 Years Old, Female Preventive care refers to lifestyle choices and visits with your health care provider that can promote health and wellness. Preventive care visits are also called wellness exams. What can I expect for my preventive care visit? Counseling Your health care provider may ask you questions about your: Medical history, including: Past medical problems. Family medical history. Pregnancy history. Current health, including: Menstrual cycle. Method of birth control. Emotional well-being. Home life and relationship well-being. Sexual activity and sexual health. Lifestyle, including: Alcohol, nicotine or tobacco, and drug use. Access to firearms. Diet, exercise, and sleep habits. Work and work Statistician. Sunscreen use. Safety issues such as seatbelt and bike helmet use. Physical exam Your health care provider will check your: Height and weight. These may be used to calculate your BMI (body mass index). BMI is a measurement that tells if you are at a healthy weight. Waist circumference. This measures the distance around your waistline. This measurement also tells if you are at a healthy weight and may help predict your risk of certain diseases, such as type 2 diabetes and high blood pressure. Heart rate and blood pressure. Body temperature. Skin for abnormal spots. What immunizations do I need? Vaccines are usually given at various ages, according to a schedule. Your health care provider will recommend vaccines for you based on your age, medical history, and lifestyle or other factors, such as travel or where you work. What tests do I need? Screening Your health care provider may recommend screening tests for certain conditions. This may include: Lipid and cholesterol levels. Diabetes screening. This is done by checking your blood sugar (glucose) after you have not eaten for a while (fasting). Pelvic exam and Pap test. Hepatitis B test. Hepatitis C  test. HIV (human immunodeficiency virus) test. STI (sexually transmitted infection) testing, if you are at risk. Lung cancer screening. Colorectal cancer screening. Mammogram. Talk with your health care provider about when you should start having regular mammograms. This may depend on whether you have a family history of breast cancer. BRCA-related cancer screening. This may be done if you have a family history of breast, ovarian, tubal, or peritoneal cancers. Bone density scan. This is done to screen for osteoporosis. Talk with your health care provider about your test results, treatment options, and if necessary, the need for more tests. Follow these instructions at home: Eating and drinking  Eat a diet that includes fresh fruits and vegetables, whole grains, lean protein, and low-fat dairy products. Take vitamin and mineral supplements as recommended by your health care provider. Do not drink alcohol if: Your health care provider tells you not to drink. You are pregnant, may be pregnant, or are planning to become pregnant. If you drink alcohol: Limit how much you have to 0-1 drink a day. Know how much alcohol is in your drink. In the [place] one drink equals one 12 oz bottle of beer (355 mL), one 5 oz glass of wine (148 mL), or one 1 oz glass of hard liquor (44 mL). Lifestyle Brush your teeth every morning and night with fluoride toothpaste. Floss one time each day. Exercise for at least 30 minutes 5 or more days each week. Do not use any products that contain nicotine or tobacco. These products include cigarettes, chewing tobacco, and vaping devices, such as e-cigarettes. If you need help quitting, ask your health care provider. Do not use drugs. If you are sexually active, practice safe sex. Use a condom or other form of protection to prevent  STIs. If you do not wish to become pregnant, use a form of birth control. If you plan to become pregnant, see your health care provider for a  prepregnancy visit. Take aspirin only as told by your health care provider. Make sure that you understand how much to take and what form to take. Work with your health care provider to find out whether it is safe and beneficial for you to take aspirin daily. Find healthy ways to manage stress, such as: Meditation, yoga, or listening to music. Journaling. Talking to a trusted person. Spending time with friends and family. Minimize exposure to UV radiation to reduce your risk of skin cancer. Safety Always wear your seat belt while driving or riding in a vehicle. Do not drive: If you have been drinking alcohol. Do not ride with someone who has been drinking. When you are tired or distracted. While texting. If you have been using any mind-altering substances or drugs. Wear a helmet and other protective equipment during sports activities. If you have firearms in your house, make sure you follow all gun safety procedures. Seek help if you have been physically or sexually abused. What's next? Visit your health care provider once a year for an annual wellness visit. Ask your health care provider how often you should have your eyes and teeth checked. Stay up to date on all vaccines. This information is not intended to replace advice given to you by your health care provider. Make sure you discuss any questions you have with your health care provider. Document Revised: 01/18/2021 Document Reviewed: 01/18/2021 Elsevier Patient Education  [employer].

## 2021-07-12 ENCOUNTER — Ambulatory Visit (INDEPENDENT_AMBULATORY_CARE_PROVIDER_SITE_OTHER): Payer: 59 | Admitting: Obstetrics and Gynecology

## 2021-07-12 ENCOUNTER — Encounter: Payer: Self-pay | Admitting: Obstetrics and Gynecology

## 2021-07-12 ENCOUNTER — Other Ambulatory Visit (HOSPITAL_COMMUNITY)
Admission: RE | Admit: 2021-07-12 | Discharge: 2021-07-12 | Disposition: A | Discharge status: Home / Self Care | Payer: 59 | Source: Ambulatory Visit | Attending: Obstetrics and Gynecology | Admitting: Obstetrics and Gynecology

## 2021-07-12 ENCOUNTER — Other Ambulatory Visit: Payer: Self-pay

## 2021-07-12 VITALS — BP 115/82 | HR 57 | Ht 66.0 in | Wt 198.0 lb

## 2021-07-12 DIAGNOSIS — Z9889 Other specified postprocedural states: Secondary | ICD-10-CM

## 2021-07-12 DIAGNOSIS — Z01419 Encounter for gynecological examination (general) (routine) without abnormal findings: Secondary | ICD-10-CM | POA: Diagnosis not present

## 2021-07-12 DIAGNOSIS — Z1211 Encounter for screening for malignant neoplasm of colon: Secondary | ICD-10-CM | POA: Diagnosis not present

## 2021-07-12 DIAGNOSIS — E785 Hyperlipidemia, unspecified: Secondary | ICD-10-CM | POA: Diagnosis not present

## 2021-07-12 DIAGNOSIS — E039 Hypothyroidism, unspecified: Secondary | ICD-10-CM

## 2021-07-12 DIAGNOSIS — Z124 Encounter for screening for malignant neoplasm of cervix: Secondary | ICD-10-CM | POA: Insufficient documentation

## 2021-07-12 DIAGNOSIS — E669 Obesity, unspecified: Secondary | ICD-10-CM

## 2021-07-17 LAB — CYTOLOGY - PAP
Comment: NEGATIVE
Diagnosis: NEGATIVE
High risk HPV: NEGATIVE

## 2021-07-26 DIAGNOSIS — E039 Hypothyroidism, unspecified: Secondary | ICD-10-CM | POA: Diagnosis not present

## 2021-08-01 LAB — COLOGUARD

## 2021-09-06 ENCOUNTER — Other Ambulatory Visit: Payer: Self-pay

## 2021-09-06 ENCOUNTER — Encounter: Payer: Self-pay | Admitting: Nurse Practitioner

## 2021-09-06 ENCOUNTER — Ambulatory Visit: Payer: 59 | Admitting: Nurse Practitioner

## 2021-09-06 VITALS — BP 112/74 | HR 56 | Temp 98.0°F | Resp 10 | Ht 66.0 in | Wt 199.1 lb

## 2021-09-06 DIAGNOSIS — R0789 Other chest pain: Secondary | ICD-10-CM

## 2021-09-06 DIAGNOSIS — N3090 Cystitis, unspecified without hematuria: Secondary | ICD-10-CM

## 2021-09-06 DIAGNOSIS — R300 Dysuria: Secondary | ICD-10-CM

## 2021-09-06 HISTORY — DX: Cystitis, unspecified without hematuria: N30.90

## 2021-09-06 HISTORY — DX: Other chest pain: R07.89

## 2021-09-06 LAB — COMPREHENSIVE METABOLIC PANEL
ALT: 15 U/L (ref 0–35)
AST: 17 U/L (ref 0–37)
Albumin: 3.9 g/dL (ref 3.5–5.2)
Alkaline Phosphatase: 72 U/L (ref 39–117)
BUN: 9 mg/dL (ref 6–23)
CO2: 30 mEq/L (ref 19–32)
Calcium: 9.1 mg/dL (ref 8.4–10.5)
Chloride: 103 mEq/L (ref 96–112)
Creatinine, Ser: 0.9 mg/dL (ref 0.40–1.20)
GFR: 76.17 mL/min (ref >60.00)
Glucose, Bld: 95 mg/dL (ref 70–99)
Potassium: 4 mEq/L (ref 3.5–5.1)
Sodium: 136 mEq/L (ref 135–145)
Total Bilirubin: 0.4 mg/dL (ref 0.2–1.2)
Total Protein: 7.1 g/dL (ref 6.0–8.3)

## 2021-09-06 LAB — CBC
HCT: 40.8 % (ref 36.0–46.0)
Hemoglobin: 13.3 g/dL (ref 12.0–15.0)
MCHC: 32.5 g/dL (ref 30.0–36.0)
MCV: 86.4 fl (ref 78.0–100.0)
Platelets: 176 10*3/uL (ref 150.0–400.0)
RBC: 4.72 Mil/uL (ref 3.87–5.11)
RDW: 14.5 % (ref 11.5–15.5)
WBC: 6.5 10*3/uL (ref 4.0–10.5)

## 2021-09-06 LAB — POCT URINALYSIS DIPSTICK
Bilirubin, UA: NEGATIVE
Glucose, UA: NEGATIVE
Ketones, UA: NEGATIVE
Nitrite, UA: NEGATIVE
Protein, UA: NEGATIVE
Spec Grav, UA: <=1.005 — AB (ref 1.010–1.025)
Urobilinogen, UA: 0.2 E.U./dL
pH, UA: 6 (ref 5.0–8.0)

## 2021-09-06 MED ORDER — NITROFURANTOIN MONOHYD MACRO 100 MG PO CAPS: 100.0000 mg | ORAL_CAPSULE | Freq: Two times a day (BID) | ORAL | 0 refills | Status: AC

## 2021-09-06 NOTE — Progress Notes (Signed)
Acute Office Visit  Subjective:    Patient ID: Judith Oneal, female    DOB: 06/22/1976, 46 y.o.   MRN: [id number]  Chief Complaint  Patient presents with   Urinary Frequency    Started on 09/04/21, frequency and urgency, burning with urination. No blood in urine. Started taking Azo.      Patient is in today for Urinary symptoms  Symptoms started on 09/04/2021 With urgency, frequency,and some dysuria. She has tried azo starting on Monday and has helped. Symptoms return when the AZO wears off  No large history of UTI last one years ago  Chest tightness: Has a history of MVP and states that she normally jogs on her lunch breaks. States that appros 2 weeks ago she went for a jog and then a few hours later she experienced chest tightness that lasted for 24 hours. This concerned her. States she has seen cardiology approx 10 years ago. Denies dizziness, chest pain, syncope or presyncope. No symptoms currently has jogged since then. She did mentioned that it was cold out and that could have inflammed her lungs.  Past Medical History:  Diagnosis Date   Cardiac arrhythmia due to congenital heart disease    Chicken pox    Dyspareunia in female    Heart murmur    Mitral valve prolapse   Hypertension    Thyroid disease    Vitamin D deficiency     Past Surgical History:  Procedure Laterality Date   POLYPECTOMY  06/2014   polyp removal from uterus and mirena inserted    Family History  Problem Relation Age of Onset   Arthritis Mother    Prostate cancer Father    Lymphoma Father    Arthritis Maternal Grandfather    Hyperlipidemia Maternal Grandfather    Hypertension Maternal Grandfather    Kidney cancer Maternal Grandfather    Breast cancer Other    Breast cancer Maternal Aunt     Social History   Socioeconomic History   Marital status: Divorced    Spouse name: Not on file   Number of children: Not on file   Years of education: Not on file   Highest education level: Not  on file  Occupational History   Not on file  Tobacco Use   Smoking status: Never   Smokeless tobacco: Never  Vaping Use   Vaping Use: Never used  Substance and Sexual Activity   Alcohol use: Not Currently    Alcohol/week: 0.0 standard drinks    Comment: Occasional use, socially    Drug use: No   Sexual activity: Not Currently    Birth control/protection: None  Other Topics Concern   Not on file  Social History Narrative   Single.   No children.   Works at [employer] as a Freight forwarder.   Enjo[place] to [place] lake, reading.   Social Determinants of Health   Financial Resource Strain: Not on file  Food Insecurity: Not on file  Transportation Needs: Not on file  Physical Activity: Not on file  Stress: Not on file  Social Connections: Not on file  Intimate Partner Violence: Not on file    Outpatient Medications Prior to Visit  Medication Sig Dispense Refill   Cholecalciferol (VITAMIN D3) 2000 UNITS TABS Take 2 tablets by mouth daily.     Cyanocobalamin (B-12) 1000 MCG CAPS      folic acid (FOLVITE) 270 MCG tablet Take 400 mcg by mouth daily.     levothyroxine (SYNTHROID) 100 MCG  tablet Take 1 tablet by mouth every morning on an empty stomach with water only.  No food or other medications for 30 minutes. 90 tablet 3   Multiple Vitamin (MULTI VITAMIN DAILY PO) Take 1 tablet by mouth daily.     Omega-3 Fatty Acids (FISH OIL) 1000 MG CAPS      Psyllium (METAMUCIL) 0.36 g CAPS      Zinc 100 MG TABS      No facility-administered medications prior to visit.    No Known Allergies  Review of Systems  Constitutional:  Negative for chills and fever.  Respiratory:  Negative for shortness of breath.   Cardiovascular:  Negative for chest pain.  Gastrointestinal:  Negative for abdominal pain, diarrhea, nausea and vomiting.  Genitourinary:  Positive for frequency and urgency. Negative for dysuria.       Incomplete empty  Musculoskeletal:  Negative for back pain.      Objective:     Physical Exam Vitals and nursing note reviewed.  Constitutional:      Appearance: Normal appearance.  Cardiovascular:     Rate and Rhythm: Normal rate and regular rhythm.     Heart sounds: Normal heart sounds.  Pulmonary:     Effort: Pulmonary effort is normal.     Breath sounds: Normal breath sounds.  Abdominal:     General: There is no distension.     Palpations: There is no mass.     Tenderness: There is no abdominal tenderness. There is no right CVA tenderness or left CVA tenderness.  Skin:    General: Skin is warm.  Neurological:     Mental Status: She is alert.    BP 112/74    Pulse (!) 56    Temp 98 F (36.7 C)    Resp 10    Ht 5' 6"  (1.676 m)    Wt 199 lb 2 oz (90.3 kg)    SpO2 97%    BMI 32.14 kg/m  Wt Readings from Last 3 Encounters:  09/06/21 199 lb 2 oz (90.3 kg)  07/12/21 198 lb (89.8 kg)  03/28/21 202 lb (91.6 kg)    Health Maintenance Due  Topic Date Due   COLONOSCOPY (Pts 45-69yr Insurance coverage will need to be confirmed)  Never done   COVID-19 Vaccine (3 - Booster for [employer]) 09/03/2020    There are no preventive care reminders to display for this patient.   Lab Results  Component Value Date   TSH 0.64 03/28/2021   Lab Results  Component Value Date   WBC 5.3 03/28/2021   HGB 13.9 03/28/2021   HCT 42.2 03/28/2021   MCV 86.1 03/28/2021   PLT 194.0 03/28/2021   Lab Results  Component Value Date   NA 138 03/28/2021   K 4.7 03/28/2021   CO2 28 03/28/2021   GLUCOSE 98 03/28/2021   BUN 12 03/28/2021   CREATININE 0.99 03/28/2021   BILITOT 0.5 03/28/2021   ALKPHOS 86 03/28/2021   AST 18 03/28/2021   ALT 20 03/28/2021   PROT 6.9 03/28/2021   ALBUMIN 4.0 03/28/2021   CALCIUM 9.4 03/28/2021   GFR 68.15 03/28/2021   Lab Results  Component Value Date   CHOL 225 (H) 03/28/2021   Lab Results  Component Value Date   HDL 49.00 03/28/2021   Lab Results  Component Value Date   LDLCALC 148 (H) 03/28/2021   Lab Results   Component Value Date   TRIG 143.0 03/28/2021   Lab Results  Component Value  Date   CHOLHDL 5 03/28/2021   Lab Results  Component Value Date   HGBA1C 5.6 04/02/2019       Assessment & Plan:   Problem List Items Addressed This Visit       Genitourinary   Cystitis    UA indicative of urinary tract infection.  We will treat with Macrobid 100 mg twice daily for 5 days.  Pending urine culture.  Patient to continue AZO for 2 more days and needs to stop to make sure antibiotic is working appropriately.  Return to clinic precautions reviewed      Relevant Medications   nitrofurantoin, macrocrystal-monohydrate, (MACROBID) 100 MG capsule     Other   Chest tightness    Patient has history of mitral valve prolapse.  Has been evaluated by cardiology approximately 10 years ago.  Has not had trouble has not had a follow-up.  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  Lasted for 24 hours and self resolved she is concerned.  EKG in office within normal limits.  We will check basic labs.  Told her signs and symptoms to watch out for when to return to clinic or go to emergency department.  Pending lab results      Relevant Orders   EKG 12-Lead (Completed)   CBC   Comprehensive metabolic panel   Other Visit Diagnoses     Dysuria    -  Primary   Relevant Orders   Urine Culture   POCT urinalysis dipstick (Completed)        Meds ordered this encounter  Medications   nitrofurantoin, macrocrystal-monohydrate, (MACROBID) 100 MG capsule    Sig: Take 1 capsule (100 mg total) by mouth 2 (two) times daily for 5 days.    Dispense:  10 capsule    Refill:  0    Order Specific Question:   Supervising Provider    Answer:   Loura Pardon A [[id number]   This visit occurred during the SARS-CoV-2 public health emergency.  Safety protocols were in place, including screening questions prior to the visit, additional usage of  staff PPE, and extensive cleaning of exam room while observing appropriate contact time as indicated for disinfecting solutions.    Romilda Garret, NP

## 2021-09-06 NOTE — Assessment & Plan Note (Signed)
Patient has history of mitral valve prolapse.  Has been evaluated by cardiology approximately 10 years ago.  Has not had trouble has not had a follow-up.  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  Lasted for 24 hours and self resolved she is concerned.  EKG in office within normal limits.  We will check basic labs.  Told her signs and symptoms to watch out for when to return to clinic or go to emergency department.  Pending lab results

## 2021-09-06 NOTE — Assessment & Plan Note (Signed)
UA indicative of urinary tract infection.  We will treat with Macrobid 100 mg twice daily for 5 days.  Pending urine culture.  Patient to continue AZO for 2 more days and needs to stop to make sure antibiotic is working appropriately.  Return to clinic precautions reviewed

## 2021-09-06 NOTE — Patient Instructions (Signed)
Nice to see you today EKg looks good in office I will be in touch with labs results Antibiotic sent to your pharmacy Follow up if no improvement, symptoms get worse or new symptoms develop

## 2021-09-08 LAB — URINE CULTURE
MICRO NUMBER:: 12949251
SPECIMEN QUALITY:: ADEQUATE

## 2021-09-10 DIAGNOSIS — Z1211 Encounter for screening for malignant neoplasm of colon: Secondary | ICD-10-CM | POA: Diagnosis not present

## 2021-09-11 ENCOUNTER — Other Ambulatory Visit: Payer: Self-pay | Admitting: Primary Care

## 2021-09-11 DIAGNOSIS — Z1231 Encounter for screening mammogram for malignant neoplasm of breast: Secondary | ICD-10-CM

## 2021-09-20 LAB — COLOGUARD: COLOGUARD: NEGATIVE

## 2021-10-18 ENCOUNTER — Ambulatory Visit
Admission: RE | Admit: 2021-10-18 | Discharge: 2021-10-18 | Disposition: A | Discharge status: Home / Self Care | Payer: 59 | Source: Ambulatory Visit | Attending: Primary Care | Admitting: Primary Care

## 2021-10-18 ENCOUNTER — Other Ambulatory Visit: Payer: Self-pay

## 2021-10-18 DIAGNOSIS — Z1231 Encounter for screening mammogram for malignant neoplasm of breast: Secondary | ICD-10-CM | POA: Insufficient documentation

## 2022-02-19 DIAGNOSIS — Z713 Dietary counseling and surveillance: Secondary | ICD-10-CM | POA: Diagnosis not present

## 2022-03-28 ENCOUNTER — Encounter: Payer: Self-pay | Admitting: Primary Care

## 2022-03-28 ENCOUNTER — Ambulatory Visit: Payer: 59 | Admitting: Primary Care

## 2022-03-28 VITALS — BP 110/67 | HR 62 | Temp 98.6°F | Ht 66.0 in | Wt 200.0 lb

## 2022-03-28 DIAGNOSIS — N289 Disorder of kidney and ureter, unspecified: Secondary | ICD-10-CM | POA: Diagnosis not present

## 2022-03-28 DIAGNOSIS — R5382 Chronic fatigue, unspecified: Secondary | ICD-10-CM | POA: Diagnosis not present

## 2022-03-28 DIAGNOSIS — E039 Hypothyroidism, unspecified: Secondary | ICD-10-CM | POA: Diagnosis not present

## 2022-03-28 DIAGNOSIS — M25562 Pain in left knee: Secondary | ICD-10-CM

## 2022-03-28 DIAGNOSIS — E785 Hyperlipidemia, unspecified: Secondary | ICD-10-CM

## 2022-03-28 HISTORY — DX: Pain in left knee: M25.562

## 2022-03-28 LAB — LIPID PANEL
Cholesterol: 242 mg/dL — ABNORMAL HIGH (ref 0–200)
HDL: 52.3 mg/dL (ref >39.00)
LDL Cholesterol: 166 mg/dL — ABNORMAL HIGH (ref 0–99)
NonHDL: 189.81
Total CHOL/HDL Ratio: 5
Triglycerides: 120 mg/dL (ref 0.0–149.0)
VLDL: 24 mg/dL (ref 0.0–40.0)

## 2022-03-28 LAB — TSH: TSH: 0.29 u[IU]/mL — ABNORMAL LOW (ref 0.35–5.50)

## 2022-03-28 LAB — COMPREHENSIVE METABOLIC PANEL
ALT: 21 U/L (ref 0–35)
AST: 17 U/L (ref 0–37)
Albumin: 4.1 g/dL (ref 3.5–5.2)
Alkaline Phosphatase: 85 U/L (ref 39–117)
BUN: 15 mg/dL (ref 6–23)
CO2: 29 mEq/L (ref 19–32)
Calcium: 9.2 mg/dL (ref 8.4–10.5)
Chloride: 103 mEq/L (ref 96–112)
Creatinine, Ser: 0.97 mg/dL (ref 0.40–1.20)
GFR: 69.35 mL/min (ref >60.00)
Glucose, Bld: 103 mg/dL — ABNORMAL HIGH (ref 70–99)
Potassium: 4.4 mEq/L (ref 3.5–5.1)
Sodium: 138 mEq/L (ref 135–145)
Total Bilirubin: 0.4 mg/dL (ref 0.2–1.2)
Total Protein: 6.9 g/dL (ref 6.0–8.3)

## 2022-03-28 NOTE — Assessment & Plan Note (Signed)
Reviewed labs from endocrinology through [facility] in December 2022.  Continue levothyroxine 100 mcg for now. Repeat TSH level pending.
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
## 2022-03-28 NOTE — Assessment & Plan Note (Signed)
Improved in February 2023, repeat renal function pending today.

## 2022-03-28 NOTE — Patient Instructions (Signed)
Stop by the lab prior to leaving today. I will notify you of your results once received.   It was a pleasure to see you today!  

## 2022-03-28 NOTE — Assessment & Plan Note (Signed)
Likely secondary to left lateral ligamental strain. No obvious deformity or tear  Fortunately, she is improving. Discussed conservative treatment. Follow up with sports medicine if no improvement.

## 2022-03-28 NOTE — Assessment & Plan Note (Signed)
Discussed the importance of a healthy diet and regular exercise in order for weight loss, and to reduce the risk of further co-morbidity.  Repeat lipid panel pending today. 

## 2022-03-28 NOTE — Assessment & Plan Note (Signed)
Continued.  Repeat TSH pending. Encouraged exercise, healthy diet.

## 2022-03-29 ENCOUNTER — Other Ambulatory Visit (INDEPENDENT_AMBULATORY_CARE_PROVIDER_SITE_OTHER): Payer: 59

## 2022-03-29 DIAGNOSIS — E0590 Thyrotoxicosis, unspecified without thyrotoxic crisis or storm: Secondary | ICD-10-CM

## 2022-03-29 LAB — T4, FREE: Free T4: 1.23 ng/dL (ref 0.60–1.60)

## 2022-03-30 ENCOUNTER — Other Ambulatory Visit: Payer: Self-pay | Admitting: Primary Care

## 2022-03-30 DIAGNOSIS — E039 Hypothyroidism, unspecified: Secondary | ICD-10-CM

## 2022-03-30 MED ORDER — LEVOTHYROXINE SODIUM 88 MCG PO TABS: ORAL_TABLET | ORAL | 0 refills | Status: DC

## 2022-05-30 ENCOUNTER — Other Ambulatory Visit (INDEPENDENT_AMBULATORY_CARE_PROVIDER_SITE_OTHER): Payer: 59

## 2022-05-30 DIAGNOSIS — E039 Hypothyroidism, unspecified: Secondary | ICD-10-CM

## 2022-05-30 LAB — TSH: TSH: 4.18 u[IU]/mL (ref 0.35–5.50)

## 2022-06-24 ENCOUNTER — Telehealth: Payer: Self-pay | Admitting: Primary Care

## 2022-06-24 DIAGNOSIS — E039 Hypothyroidism, unspecified: Secondary | ICD-10-CM

## 2022-06-24 NOTE — Telephone Encounter (Signed)
Please call patient:  How is she feeling since we reduced her dose of levothyroxine to 88 mcg? Would like to repeat thyroid levels again in 2 months. Lab only appt is fine.

## 2022-06-25 NOTE — Telephone Encounter (Signed)
Unable to reach patient. Left voicemail to return call to our office.   

## 2022-06-26 NOTE — Telephone Encounter (Signed)
Pt called returning Kellie's call. Pt stated the meds have been working fine for her, no issues at all. Scheduled labs for 08/27/22. Call back # [phone]

## 2022-06-26 NOTE — Telephone Encounter (Signed)
Unable to reach patient. Left voicemail to return call to our office.   

## 2022-06-26 NOTE — Telephone Encounter (Signed)
Great to hear, thanks!

## 2022-08-14 ENCOUNTER — Other Ambulatory Visit: Payer: Self-pay | Admitting: Primary Care

## 2022-08-14 DIAGNOSIS — E039 Hypothyroidism, unspecified: Secondary | ICD-10-CM

## 2022-08-27 ENCOUNTER — Other Ambulatory Visit (INDEPENDENT_AMBULATORY_CARE_PROVIDER_SITE_OTHER): Payer: 59

## 2022-08-27 DIAGNOSIS — E039 Hypothyroidism, unspecified: Secondary | ICD-10-CM | POA: Diagnosis not present

## 2022-08-27 LAB — TSH: TSH: 1.91 u[IU]/mL (ref 0.35–5.50)

## 2022-09-19 ENCOUNTER — Other Ambulatory Visit: Payer: Self-pay | Admitting: Primary Care

## 2022-09-19 DIAGNOSIS — E039 Hypothyroidism, unspecified: Secondary | ICD-10-CM

## 2023-03-06 ENCOUNTER — Encounter (INDEPENDENT_AMBULATORY_CARE_PROVIDER_SITE_OTHER): Payer: Self-pay

## 2023-03-11 ENCOUNTER — Other Ambulatory Visit: Payer: Self-pay | Admitting: Primary Care

## 2023-03-11 DIAGNOSIS — E039 Hypothyroidism, unspecified: Secondary | ICD-10-CM

## 2023-03-26 ENCOUNTER — Encounter: Payer: Self-pay | Admitting: Primary Care

## 2023-03-26 ENCOUNTER — Ambulatory Visit: Payer: 59 | Admitting: Primary Care

## 2023-03-26 VITALS — BP 110/62 | HR 71 | Temp 97.6°F | Ht 66.0 in | Wt 210.0 lb

## 2023-03-26 DIAGNOSIS — E785 Hyperlipidemia, unspecified: Secondary | ICD-10-CM | POA: Diagnosis not present

## 2023-03-26 DIAGNOSIS — Z0000 Encounter for general adult medical examination without abnormal findings: Secondary | ICD-10-CM | POA: Diagnosis not present

## 2023-03-26 DIAGNOSIS — E039 Hypothyroidism, unspecified: Secondary | ICD-10-CM

## 2023-03-26 DIAGNOSIS — Z1231 Encounter for screening mammogram for malignant neoplasm of breast: Secondary | ICD-10-CM

## 2023-03-26 LAB — CBC
HCT: 41.7 % (ref 36.0–46.0)
Hemoglobin: 13.8 g/dL (ref 12.0–15.0)
MCHC: 33.2 g/dL (ref 30.0–36.0)
MCV: 84.9 fl (ref 78.0–100.0)
Platelets: 211 10*3/uL (ref 150.0–400.0)
RBC: 4.91 Mil/uL (ref 3.87–5.11)
RDW: 14.8 % (ref 11.5–15.5)
WBC: 4.2 10*3/uL (ref 4.0–10.5)

## 2023-03-26 LAB — LIPID PANEL
Cholesterol: 236 mg/dL — ABNORMAL HIGH (ref 0–200)
HDL: 45.7 mg/dL (ref >39.00)
LDL Cholesterol: 159 mg/dL — ABNORMAL HIGH (ref 0–99)
NonHDL: 190.62
Total CHOL/HDL Ratio: 5
Triglycerides: 160 mg/dL — ABNORMAL HIGH (ref 0.0–149.0)
VLDL: 32 mg/dL (ref 0.0–40.0)

## 2023-03-26 LAB — COMPREHENSIVE METABOLIC PANEL
ALT: 18 U/L (ref 0–35)
AST: 17 U/L (ref 0–37)
Albumin: 3.8 g/dL (ref 3.5–5.2)
Alkaline Phosphatase: 89 U/L (ref 39–117)
BUN: 14 mg/dL (ref 6–23)
CO2: 27 mEq/L (ref 19–32)
Calcium: 9 mg/dL (ref 8.4–10.5)
Chloride: 106 mEq/L (ref 96–112)
Creatinine, Ser: 0.98 mg/dL (ref 0.40–1.20)
GFR: 68.03 mL/min (ref >60.00)
Glucose, Bld: 106 mg/dL — ABNORMAL HIGH (ref 70–99)
Potassium: 4.2 mEq/L (ref 3.5–5.1)
Sodium: 141 mEq/L (ref 135–145)
Total Bilirubin: 0.4 mg/dL (ref 0.2–1.2)
Total Protein: 6.6 g/dL (ref 6.0–8.3)

## 2023-03-26 LAB — TSH: TSH: 0.61 u[IU]/mL (ref 0.35–5.50)

## 2023-03-26 NOTE — Progress Notes (Signed)
Subjective:    Patient ID: Judith Oneal, female    DOB: 08/02/1976, 47 y.o.   MRN: [id number]  HPI  Laurieanne Cavalcante is a very pleasant 47 y.o. female who presents today for complete physical and follow up of chronic conditions.  Immunizations: -Tetanus: Completed in  2022  Diet: Fair diet.  Exercise: Jogging on treadmill   Eye exam: Completes annually  Dental exam: Completes annually    Pap Smear: Completed in 2022 Mammogram: Completed in March 2023  Colonoscopy: Completed Cologuard screening in 2023, negative.     Review of Systems  Constitutional:  Negative for unexpected weight change.  HENT:  Negative for rhinorrhea.   Respiratory:  Negative for cough and shortness of breath.   Cardiovascular:  Negative for chest pain.  Gastrointestinal:  Negative for constipation and diarrhea.  Genitourinary:  Negative for difficulty urinating and menstrual problem.  Musculoskeletal:  Negative for arthralgias and myalgias.  Skin:  Negative for rash.  Allergic/Immunologic: Negative for environmental allergies.  Neurological:  Negative for dizziness, numbness and headaches.  Psychiatric/Behavioral:  The patient is not nervous/anxious.          Past Medical History:  Diagnosis Date   Acute pain of left knee 03/28/2022   Cardiac arrhythmia due to congenital heart disease    Chest tightness 09/06/2021   Chicken pox    Chronic pain of left lower extremity 03/28/2021   Cystitis 09/06/2021   Dyspareunia in female    Heart murmur    Mitral valve prolapse   Hyperglycemia 04/02/2019   Hypertension    Thyroid disease    Vitamin D deficiency     Social History   Socioeconomic History   Marital status: Divorced    Spouse name: Not on file   Number of children: Not on file   Years of education: Not on file   Highest education level: Not on file  Occupational History   Not on file  Tobacco Use   Smoking status: Never   Smokeless tobacco: Never  Vaping Use   Vaping  status: Never Used  Substance and Sexual Activity   Alcohol use: Not Currently    Alcohol/week: 0.0 standard drinks of alcohol    Comment: Occasional use, socially    Drug use: No   Sexual activity: Not Currently    Birth control/protection: None  Other Topics Concern   Not on file  Social History Narrative   Single.   No children.   Works at [employer] as a Production designer, theatre/television/film.   Enjoys going to the lake, reading.   Social Determinants of Health   Financial Resource Strain: Not on file  Food Insecurity: Not on file  Transportation Needs: Not on file  Physical Activity: Not on file  Stress: Not on file  Social Connections: Not on file  Intimate Partner Violence: Not on file    Past Surgical History:  Procedure Laterality Date   POLYPECTOMY  06/2014   polyp removal from uterus and mirena inserted    Family History  Problem Relation Age of Onset   Arthritis Mother    Prostate cancer Father    Lymphoma Father    Arthritis Maternal Grandfather    Hyperlipidemia Maternal Grandfather    Hypertension Maternal Grandfather    Kidney cancer Maternal Grandfather    Breast cancer Other    Breast cancer Maternal Aunt     No Known Allergies  Current Outpatient Medications on File Prior to Visit  Medication Sig Dispense Refill  Cholecalciferol (VITAMIN D3) 2000 UNITS TABS Take 2 tablets by mouth daily.     Cyanocobalamin (B-12) 1000 MCG CAPS      folic acid (FOLVITE) 400 MCG tablet Take 400 mcg by mouth daily.     levothyroxine (SYNTHROID) 88 MCG tablet TAKE 1 TAB BY MOUTH EVERY AM ON AN EMPTY STOMACH W/WATER ONLY. NO FOOD OR OTHER RXS FOR 30 MINUTES. 90 tablet 0   Multiple Vitamin (MULTI VITAMIN DAILY PO) Take 1 tablet by mouth daily.     Omega-3 Fatty Acids (FISH OIL) 1000 MG CAPS      Psyllium (METAMUCIL) 0.36 g CAPS      Zinc 100 MG TABS      No current facility-administered medications on file prior to visit.    BP 110/62   Pulse 71   Temp 97.6 F (36.4 C) (Temporal)    Ht 5\' 6"  (1.676 m)   Wt 210 lb (95.3 kg)   SpO2 98%   BMI 33.89 kg/m  Objective:   Physical Exam HENT:     Right Ear: Tympanic membrane and ear canal normal.     Left Ear: Tympanic membrane and ear canal normal.     Nose: Nose normal.  Eyes:     Conjunctiva/sclera: Conjunctivae normal.     Pupils: Pupils are equal, round, and reactive to light.  Neck:     Thyroid: No thyromegaly.  Cardiovascular:     Rate and Rhythm: Normal rate and regular rhythm.     Heart sounds: No murmur heard. Pulmonary:     Effort: Pulmonary effort is normal.     Breath sounds: Normal breath sounds. No rales.  Abdominal:     General: Bowel sounds are normal.     Palpations: Abdomen is soft.     Tenderness: There is no abdominal tenderness.  Musculoskeletal:        General: Normal range of motion.     Cervical back: Neck supple.  Lymphadenopathy:     Cervical: No cervical adenopathy.  Skin:    General: Skin is warm and dry.     Findings: No rash.  Neurological:     Mental Status: She is alert and oriented to person, place, and time.     Cranial Nerves: No cranial nerve deficit.     Deep Tendon Reflexes: Reflexes are normal and symmetric.  Psychiatric:        Mood and Affect: Mood normal.           Assessment & Plan:  Preventative health care Assessment & Plan: Immunizations UTD. Pap smear UTD. Mammogram due, orders placed. Colon cancer screening up-to-date, due 2026  Discussed the importance of a healthy diet and regular exercise in order for weight loss, and to reduce the risk of further co-morbidity.  Exam stable. Labs pending.  Follow up in 1 year for repeat physical.    Screening mammogram for breast cancer -     3D Screening Mammogram, Left and Right; Future  Hyperlipidemia, unspecified hyperlipidemia type Assessment & Plan: Repeat lipid panel pending.  Discussed the importance of a healthy diet and regular exercise in order for weight loss, and to reduce the risk of  further co-morbidity.   Orders: -     Lipid panel -     Comprehensive metabolic panel -     CBC  Acquired hypothyroidism Assessment & Plan: She is levothyroxine correctly.  Continue levothyroxine 88 mcg daily. Repeat TSH pending.  Orders: -     TSH  Doreene Nest, NP

## 2023-03-26 NOTE — Patient Instructions (Signed)
Stop by the lab prior to leaving today. I will notify you of your results once received.   [facility].   It was a pleasure to see you today!   

## 2023-03-26 NOTE — Assessment & Plan Note (Signed)
Immunizations UTD. Pap smear UTD. Mammogram due, orders placed. Colon cancer screening up-to-date, due 2026  Discussed the importance of a healthy diet and regular exercise in order for weight loss, and to reduce the risk of further co-morbidity.  Exam stable. Labs pending.  Follow up in 1 year for repeat physical.

## 2023-03-26 NOTE — Assessment & Plan Note (Signed)
Repeat lipid panel pending.  Discussed the importance of a healthy diet and regular exercise in order for weight loss, and to reduce the risk of further co-morbidity.  

## 2023-03-26 NOTE — Assessment & Plan Note (Signed)
She is levothyroxine correctly.  Continue levothyroxine 88 mcg daily. Repeat TSH pending.

## 2023-04-03 ENCOUNTER — Ambulatory Visit
Admission: RE | Admit: 2023-04-03 | Discharge: 2023-04-03 | Disposition: A | Discharge status: Home / Self Care | Payer: 59 | Source: Ambulatory Visit | Attending: Primary Care | Admitting: Primary Care

## 2023-04-03 DIAGNOSIS — Z1231 Encounter for screening mammogram for malignant neoplasm of breast: Secondary | ICD-10-CM

## 2023-06-07 ENCOUNTER — Other Ambulatory Visit: Payer: Self-pay | Admitting: Primary Care

## 2023-06-07 DIAGNOSIS — E039 Hypothyroidism, unspecified: Secondary | ICD-10-CM

## 2023-07-01 ENCOUNTER — Ambulatory Visit: Payer: 59 | Admitting: Primary Care

## 2023-07-01 VITALS — BP 122/78 | HR 70 | Temp 97.3°F | Ht 66.0 in | Wt 212.0 lb

## 2023-07-01 DIAGNOSIS — M778 Other enthesopathies, not elsewhere classified: Secondary | ICD-10-CM | POA: Insufficient documentation

## 2023-07-01 MED ORDER — PREDNISONE 20 MG PO TABS: ORAL_TABLET | ORAL | 0 refills | Status: DC

## 2023-07-01 NOTE — Assessment & Plan Note (Signed)
Exam without alarm signs, but she has clearly been struggling for several months.  Start prednisone 20 mg tablets. Take 3 tablets my mouth once daily in the morning for 3 days, then 2 tablets for 3 days, then 1 tablet for 3 days.  Ultimately, she may require steroid injections.  She will schedule an appoint with our sports medicine physician.  Continue bracing, ice, rest.

## 2023-07-01 NOTE — Progress Notes (Signed)
Subjective:    Patient ID: Judith Oneal, female    DOB: April 11, 1976, 47 y.o.   MRN: [id number]  Arm Pain  Pertinent negatives include no numbness.    Judith Oneal is a very pleasant 47 y.o. female with a history of hypothyroidism, hyperlipidemia, fatigue who presents today to discuss upper extremity pain.   Her pain is located to the left lateral elbow with radiation to her left humeral region for which originally began in her 74s. She was diagnosed with tendonitis at that time, treated with steroid injections with resolve in symptoms.  Flares typically occur infrequently. About 10 years ago she has a severe case, had to undergo two cortisone injections which helped to relieve her symptoms.   Her most recent flare began 3 months ago, waxes and wanes in intensity. Located to the left lateral elbow with radiation up to her mid humeral region. Her pain is worse with certain movements including moving her arm medially across her chest and reaching her arm above her head.   Over the last few weeks her pain has become constant with rest and movement. She's been wearing her tendonitis band, icing her elbow, and taking Aleve with temporary improvement. She denies numbness/tingling, loss in strength. She cannot put weight on her arm due to pain.     Review of Systems  Musculoskeletal:  Positive for arthralgias and myalgias. Negative for joint swelling.  Skin:  Negative for color change.  Neurological:  Negative for numbness.         Past Medical History:  Diagnosis Date   Acute pain of left knee 03/28/2022   Cardiac arrhythmia due to congenital heart disease    Chest tightness 09/06/2021   Chicken pox    Chronic pain of left lower extremity 03/28/2021   Cystitis 09/06/2021   Dyspareunia in female    Heart murmur    Mitral valve prolapse   Hyperglycemia 04/02/2019   Hypertension    Thyroid disease    Vitamin D deficiency     Social History   Socioeconomic History   Marital  status: Divorced    Spouse name: Not on file   Number of children: Not on file   Years of education: Not on file   Highest education level: Some college, no degree  Occupational History   Not on file  Tobacco Use   Smoking status: Never   Smokeless tobacco: Never  Vaping Use   Vaping status: Never Used  Substance and Sexual Activity   Alcohol use: Not Currently    Alcohol/week: 0.0 standard drinks of alcohol    Comment: Occasional use, socially    Drug use: No   Sexual activity: Not Currently    Birth control/protection: None  Other Topics Concern   Not on file  Social History Narrative   Single.   No children.   Works at [employer] as a Production designer, theatre/television/film.   Enjoys going to the lake, reading.   Social Determinants of Health   Financial Resource Strain: Medium Risk (07/01/2023)   Overall Financial Resource Strain (CARDIA)    Difficulty of Paying Living Expenses: Somewhat hard  Food Insecurity: No Food Insecurity (07/01/2023)   Hunger Vital Sign    Worried About Running Out of Food in the Last Year: Never true    Ran Out of Food in the Last Year: Never true  Transportation Needs: No Transportation Needs (07/01/2023)   PRAPARE - Administrator, Civil Service (Medical): No    Lack  of Transportation (Non-Medical): No  Physical Activity: Insufficiently Active (07/01/2023)   Exercise Vital Sign    Days of Exercise per Week: 3 days    Minutes of Exercise per Session: 30 min  Stress: No Stress Concern Present (07/01/2023)   [employer] of Occupational Health - Occupational Stress Questionnaire    Feeling of Stress : Not at all  Social Connections: Moderately Isolated (07/01/2023)   Social Connection and Isolation Panel [NHANES]    Frequency of Communication with Friends and Family: Three times a week    Frequency of Social Gatherings with Friends and Family: Once a week    Attends Religious Services: 1 to 4 times per year    Active Member of [employer] or  Organizations: No    Attends Engineer, structural: Not on file    Marital Status: Divorced  Intimate Partner Violence: Not on file    Past Surgical History:  Procedure Laterality Date   POLYPECTOMY  06/2014   polyp removal from uterus and mirena inserted    Family History  Problem Relation Age of Onset   Arthritis Mother    Prostate cancer Father    Lymphoma Father    Arthritis Maternal Grandfather    Hyperlipidemia Maternal Grandfather    Hypertension Maternal Grandfather    Kidney cancer Maternal Grandfather    Breast cancer Other    Breast cancer Maternal Aunt     No Known Allergies  Current Outpatient Medications on File Prior to Visit  Medication Sig Dispense Refill   Cholecalciferol (VITAMIN D3) 2000 UNITS TABS Take 2 tablets by mouth daily.     Cyanocobalamin (B-12) 1000 MCG CAPS      folic acid (FOLVITE) 400 MCG tablet Take 400 mcg by mouth daily.     levothyroxine (SYNTHROID) 88 MCG tablet TAKE 1 TAB BY MOUTH EVERY AM ON AN EMPTY STOMACH W/WATER ONLY. NO FOOD OR OTHER RXS FOR 30 MINUTES. 90 tablet 2   Multiple Vitamin (MULTI VITAMIN DAILY PO) Take 1 tablet by mouth daily.     Omega-3 Fatty Acids (FISH OIL) 1000 MG CAPS      Psyllium (METAMUCIL) 0.36 g CAPS      Zinc 100 MG TABS      No current facility-administered medications on file prior to visit.    BP 122/78   Pulse 70   Temp (!) 97.3 F (36.3 C) (Temporal)   Ht 5\' 6"  (1.676 m)   Wt 212 lb (96.2 kg)   SpO2 99%   BMI 34.22 kg/m  Objective:   Physical Exam Constitutional:      General: She is not in acute distress. Musculoskeletal:     Left upper arm: No tenderness.     Left elbow: No swelling. Normal range of motion. No tenderness.       Arms:     Comments: Pain with ROM to left upper extremity with medial adduction, forward abduction, posterior abduction. 5/5 strength to bilateral upper extremities.            Assessment & Plan:  Tendonitis of elbow, left Assessment &  Plan: Exam without alarm signs, but she has clearly been struggling for several months.  Start prednisone 20 mg tablets. Take 3 tablets my mouth once daily in the morning for 3 days, then 2 tablets for 3 days, then 1 tablet for 3 days.  Ultimately, she may require steroid injections.  She will schedule an appoint with our sports medicine physician.  Continue bracing, ice, rest.  Orders: -     predniSONE; Take 3 tablets my mouth once daily in the morning for 3 days, then 2 tablets for 3 days, then 1 tablet for 3 days.  Dispense: 18 tablet; Refill: 0        Doreene Nest, NP

## 2023-07-01 NOTE — Patient Instructions (Addendum)
Start prednisone medication for your elbow. Take 3 tablets my mouth once daily in the morning for 3 days, then 2 tablets for 3 days, then 1 tablet for 3 days.  Schedule a visit with Dr. Patsy Lager, our sports medicine doctor.  It was a pleasure to see you today!

## 2023-07-07 NOTE — Progress Notes (Unsigned)
    Judith Oneal T. Reef Achterberg, MD, CAQ Sports Medicine [facility] [address] [place], [address]  Phone: [phone]  FAX: [phone]  Judith Oneal - 47 y.o. female  MRN [id number]  Date of Birth: 1975/11/02  Date: 07/08/2023  PCP: Doreene Nest, NP  Referral: Doreene Nest, NP  No chief complaint on file.  Subjective:   Judith Oneal is a 47 y.o. very pleasant female patient with There is no height or weight on file to calculate BMI. who presents with the following:  Patient presents with ongoing left-sided tennis elbow.  She recently saw Dr. Chestine Spore, and she placed her on some oral steroids.  Historically she has had some issues off and on on her elbow since her 61s.  She has had some lateral epicondylar injections in the past.    Review of Systems is noted in the HPI, as appropriate  Objective:   There were no vitals taken for this visit.  GEN: No acute distress; alert,appropriate. PULM: Breathing comfortably in no respiratory distress PSYCH: Normally interactive.   Laboratory and Imaging Data:  Assessment and Plan:   ***

## 2023-07-08 ENCOUNTER — Ambulatory Visit: Payer: 59 | Admitting: Family Medicine

## 2023-07-08 ENCOUNTER — Encounter: Payer: Self-pay | Admitting: Family Medicine

## 2023-07-08 VITALS — BP 104/70 | HR 79 | Temp 98.1°F | Ht 66.0 in | Wt 213.1 lb

## 2023-07-08 DIAGNOSIS — M7502 Adhesive capsulitis of left shoulder: Secondary | ICD-10-CM

## 2023-07-08 DIAGNOSIS — M7712 Lateral epicondylitis, left elbow: Secondary | ICD-10-CM

## 2023-07-08 MED ORDER — TRIAMCINOLONE ACETONIDE 40 MG/ML IJ SUSP
40.0000 mg | Freq: Once | INTRAMUSCULAR | Status: AC
  Administered 2023-07-08: 40 mg via INTRA_ARTICULAR

## 2023-07-08 MED ORDER — TRIAMCINOLONE ACETONIDE 40 MG/ML IJ SUSP
20.0000 mg | Freq: Once | INTRAMUSCULAR | Status: AC
  Administered 2023-07-08: 20 mg via INTRA_ARTICULAR

## 2023-09-08 NOTE — Progress Notes (Unsigned)
   Judith Milke T. Zarinah Oviatt, MD, CAQ Sports Medicine [facility] at [facility] [address] [place], [address]  Phone: [phone]  FAX: [phone]  Judith Oneal - 48 y.o. female  MRN [id number]  Date of Birth: 12-19-1975  Date: 09/09/2023  PCP: Doreene Nest, NP  Referral: Doreene Nest, NP  No chief complaint on file.  Subjective:   Judith Oneal is a 48 y.o. very pleasant female patient with There is no height or weight on file to calculate BMI. who presents with the following:  Patient is here to follow-up regarding shoulder pain.  The last time I saw her roughly 2 months ago, patient was having significant deep dull ache in the shoulder and restriction of motion in all directions.  Felt as if she had some adhesive capsulitis in the left side, and we did do an intra-articular injection and have her start [facility] range of motion protocol.  She also had some left-sided tennis elbow, and I did do additionally a lateral epicondylar injection at that time.    Review of Systems is noted in the HPI, as appropriate  Objective:   There were no vitals taken for this visit.  GEN: No acute distress; alert,appropriate. PULM: Breathing comfortably in no respiratory distress PSYCH: Normally interactive.   Laboratory and Imaging Data:  Assessment and Plan:   ***

## 2023-09-09 ENCOUNTER — Ambulatory Visit: Payer: 59 | Admitting: Family Medicine

## 2023-09-09 ENCOUNTER — Encounter: Payer: Self-pay | Admitting: Family Medicine

## 2023-09-09 VITALS — BP 120/78 | HR 73 | Temp 97.9°F | Ht 66.0 in | Wt 207.4 lb

## 2023-09-09 DIAGNOSIS — M7712 Lateral epicondylitis, left elbow: Secondary | ICD-10-CM

## 2023-09-09 DIAGNOSIS — M7502 Adhesive capsulitis of left shoulder: Secondary | ICD-10-CM

## 2024-02-19 ENCOUNTER — Other Ambulatory Visit: Payer: Self-pay | Admitting: Primary Care

## 2024-02-19 DIAGNOSIS — E039 Hypothyroidism, unspecified: Secondary | ICD-10-CM

## 2024-04-01 ENCOUNTER — Ambulatory Visit: Admitting: Primary Care

## 2024-04-07 ENCOUNTER — Ambulatory Visit: Admitting: Primary Care

## 2024-04-16 ENCOUNTER — Telehealth: Payer: Self-pay

## 2024-04-16 ENCOUNTER — Ambulatory Visit: Payer: Self-pay | Admitting: Primary Care

## 2024-04-16 ENCOUNTER — Encounter: Payer: Self-pay | Admitting: Primary Care

## 2024-04-16 ENCOUNTER — Other Ambulatory Visit (HOSPITAL_COMMUNITY): Payer: Self-pay

## 2024-04-16 ENCOUNTER — Ambulatory Visit: Admitting: Primary Care

## 2024-04-16 VITALS — BP 128/86 | HR 60 | Temp 97.1°F | Ht 66.0 in | Wt 216.0 lb

## 2024-04-16 DIAGNOSIS — Z0001 Encounter for general adult medical examination with abnormal findings: Secondary | ICD-10-CM

## 2024-04-16 DIAGNOSIS — E039 Hypothyroidism, unspecified: Secondary | ICD-10-CM

## 2024-04-16 DIAGNOSIS — Z23 Encounter for immunization: Secondary | ICD-10-CM | POA: Diagnosis not present

## 2024-04-16 DIAGNOSIS — Z6834 Body mass index (BMI) 34.0-34.9, adult: Secondary | ICD-10-CM | POA: Diagnosis not present

## 2024-04-16 DIAGNOSIS — E66811 Obesity, class 1: Secondary | ICD-10-CM | POA: Diagnosis not present

## 2024-04-16 DIAGNOSIS — Z1231 Encounter for screening mammogram for malignant neoplasm of breast: Secondary | ICD-10-CM

## 2024-04-16 DIAGNOSIS — E6609 Other obesity due to excess calories: Secondary | ICD-10-CM | POA: Diagnosis not present

## 2024-04-16 DIAGNOSIS — E785 Hyperlipidemia, unspecified: Secondary | ICD-10-CM

## 2024-04-16 LAB — COMPREHENSIVE METABOLIC PANEL WITH GFR
ALT: 27 U/L (ref 0–35)
AST: 20 U/L (ref 0–37)
Albumin: 4.2 g/dL (ref 3.5–5.2)
Alkaline Phosphatase: 94 U/L (ref 39–117)
BUN: 13 mg/dL (ref 6–23)
CO2: 28 meq/L (ref 19–32)
Calcium: 9.6 mg/dL (ref 8.4–10.5)
Chloride: 102 meq/L (ref 96–112)
Creatinine, Ser: 1.02 mg/dL (ref 0.40–1.20)
GFR: 64.36 mL/min (ref >60.00)
Glucose, Bld: 107 mg/dL — ABNORMAL HIGH (ref 70–99)
Potassium: 4 meq/L (ref 3.5–5.1)
Sodium: 138 meq/L (ref 135–145)
Total Bilirubin: 0.4 mg/dL (ref 0.2–1.2)
Total Protein: 7 g/dL (ref 6.0–8.3)

## 2024-04-16 LAB — LIPID PANEL
Cholesterol: 257 mg/dL — ABNORMAL HIGH (ref 0–200)
HDL: 49.7 mg/dL (ref >39.00)
LDL Cholesterol: 181 mg/dL — ABNORMAL HIGH (ref 0–99)
NonHDL: 207.05
Total CHOL/HDL Ratio: 5
Triglycerides: 131 mg/dL (ref 0.0–149.0)
VLDL: 26.2 mg/dL (ref 0.0–40.0)

## 2024-04-16 LAB — HEMOGLOBIN A1C: Hgb A1c MFr Bld: 6.3 % (ref 4.6–6.5)

## 2024-04-16 MED ORDER — SEMAGLUTIDE-WEIGHT MANAGEMENT 0.25 MG/0.5ML ~~LOC~~ SOAJ: 0.2500 mg | SUBCUTANEOUS | 0 refills | Status: DC

## 2024-04-16 NOTE — Assessment & Plan Note (Signed)
 Repeat lipid panel pending. Commended her on exercise and healthy lifestyle.

## 2024-04-16 NOTE — Patient Instructions (Signed)
 Start semaglutide  (Wegovy ) for weight loss. Start by injecting 0.25 mg into the skin once weekly for 4 weeks, then increase to 0.5 mg once weekly thereafter.  Please notify me once you have used your last 0.25 mg pen so that I can send the 0.5 mg dose to your pharmacy.  Schedule a follow-up visit for in 3 months once you receive your Wegovy  medication.  Stop by the lab prior to leaving today. I will notify you of your results once received.   It was a pleasure to see you today!

## 2024-04-16 NOTE — Assessment & Plan Note (Signed)
 First Shingrix  vaccine provided today. Declines influenza vaccine.  Pap smear UTD.  Mammogram due, orders placed. Colonoguard UTD, due 2026  Discussed the importance of a healthy diet and regular exercise in order for weight loss, and to reduce the risk of further co-morbidity.  Exam stable. Labs pending.  Follow up in 1 year for repeat physical.

## 2024-04-16 NOTE — Assessment & Plan Note (Signed)
 Chronic history.   Commended her on a healthy lifestyle and exercise. Discussed options for treatment, she opts for GLP 1 agonist treatment.  Start semaglutide  (Wegovy ) for weight loss. Start by injecting 0.25 mg into the skin once weekly for 4 weeks, then increase to 0.5 mg once weekly thereafter.   We discussed potential side effects and instructions for administration.  Will follow-up in 2 to 3 months once she has her medication.

## 2024-04-16 NOTE — Assessment & Plan Note (Signed)
 She is taking levothyroxine correctly. Continue levothyroxine 88 mcg daily. Repeat TSH pending.

## 2024-04-16 NOTE — Telephone Encounter (Signed)
 Pharmacy Patient Advocate Encounter   Received notification from CoverMyMeds that prior authorization forWegovy 0.25MG /0.5ML auto-injectors is required/requested.   Insurance verification completed.   The patient is insured through [facility] .   Per test claim: PA required; PA submitted to above mentioned insurance via Latent Key/confirmation #/EOC [id number] Status is pending

## 2024-04-16 NOTE — Progress Notes (Signed)
 Subjective:    Patient ID: Judith Oneal, female    DOB: August 09, 1975, 48 y.o.   MRN: [id number]  Judith Oneal is a very pleasant 48 y.o. female who presents today for complete physical and follow up of chronic conditions.  She would like to talk about obesity. Chronic history of obesity. Over the last 5 years she's actively tried to lose weight and has not been successful. She's tried calorie counting, weight watchers, exercising, several other programs without weight loss.  Wt Readings from Last 3 Encounters:  04/16/24 216 lb (98 kg)  09/09/23 207 lb 6 oz (94.1 kg)  07/08/23 213 lb 2 oz (96.7 kg)   Diet currently consists of:  Breakfast: Protein shake, protein bar, oatmeal Lunch: Lean protein, veggies Dinner: Humus and veggies, lean protein and veggies  Snacks: Humus and veggies, occasionally popcorn Desserts: Infrequently Beverages: Black coffee, protein shake, water  Exercise: 3-4 days weekly  Body mass index is 34.86 kg/m.   Immunizations: -Tetanus: Completed in 2022 -Influenza: Declines influenza vaccine.  -Shingles: Never completed.    Eye exam: Completes annually  Dental exam: Completes semi-annually    Pap Smear: Completed in December 2022  Mammogram: Completed in August 2024  Colonoscopy: Completed Cologuard in 2023, negative.  Due 2026  BP Readings from Last 3 Encounters:  04/16/24 128/86  09/09/23 120/78  07/08/23 104/70      Review of Systems  Constitutional:  Negative for unexpected weight change.  HENT:  Negative for rhinorrhea.   Respiratory:  Negative for cough and shortness of breath.   Cardiovascular:  Negative for chest pain.  Gastrointestinal:  Negative for constipation and diarrhea.  Genitourinary:  Negative for difficulty urinating.  Musculoskeletal:  Negative for arthralgias and myalgias.  Skin:  Negative for rash.  Allergic/Immunologic: Negative for environmental allergies.  Neurological:  Negative for dizziness and headaches.   Psychiatric/Behavioral:  The patient is not nervous/anxious.          Past Medical History:  Diagnosis Date   Acute pain of left knee 03/28/2022   Cardiac arrhythmia due to congenital heart disease    Chest tightness 09/06/2021   Chicken pox    Chronic pain of left lower extremity 03/28/2021   Cystitis 09/06/2021   Dyspareunia in female    Heart murmur    Mitral valve prolapse   Hyperglycemia 04/02/2019   Hypertension    Thyroid  disease    Vitamin D  deficiency     Social History   Socioeconomic History   Marital status: Divorced    Spouse name: Not on file   Number of children: Not on file   Years of education: Not on file   Highest education level: Some college, no degree  Occupational History   Not on file  Tobacco Use   Smoking status: Never   Smokeless tobacco: Never  Vaping Use   Vaping status: Never Used  Substance and Sexual Activity   Alcohol use: Not Currently    Alcohol/week: 0.0 standard drinks of alcohol    Comment: Occasional use, socially    Drug use: No   Sexual activity: Not Currently    Birth control/protection: None  Other Topics Concern   Not on file  Social History Narrative   Single.   No children.   Works at [employer] as a Production designer, theatre/television/film.   Enjoys going to the lake, reading.   Social Drivers of Health   Financial Resource Strain: High Risk (04/15/2024)   Overall Financial Resource Strain (CARDIA)  Difficulty of Paying Living Expenses: Hard  Food Insecurity: No Food Insecurity (04/15/2024)   Hunger Vital Sign    Worried About Running Out of Food in the Last Year: Never true    Ran Out of Food in the Last Year: Never true  Transportation Needs: No Transportation Needs (04/15/2024)   PRAPARE - Administrator, Civil Service (Medical): No    Lack of Transportation (Non-Medical): No  Physical Activity: Insufficiently Active (04/15/2024)   Exercise Vital Sign    Days of Exercise per Week: 3 days    Minutes of Exercise per  Session: 40 min  Stress: No Stress Concern Present (04/15/2024)   [employer] of Occupational Health - Occupational Stress Questionnaire    Feeling of Stress: Not at all  Social Connections: Moderately Isolated (04/15/2024)   Social Connection and Isolation Panel    Frequency of Communication with Friends and Family: Three times a week    Frequency of Social Gatherings with Friends and Family: Once a week    Attends Religious Services: 1 to 4 times per year    Active Member of [employer] or Organizations: No    Attends Engineer, structural: Not on file    Marital Status: Divorced  Intimate Partner Violence: Not on file    Past Surgical History:  Procedure Laterality Date   POLYPECTOMY  06/2014   polyp removal from uterus and mirena inserted    Family History  Problem Relation Age of Onset   Arthritis Mother    Sjogren's syndrome Mother    Prostate cancer Father    Lymphoma Father    Arthritis Maternal Grandfather    Hyperlipidemia Maternal Grandfather    Hypertension Maternal Grandfather    Kidney cancer Maternal Grandfather    Breast cancer Maternal Aunt    Breast cancer Other     No Known Allergies  Current Outpatient Medications on File Prior to Visit  Medication Sig Dispense Refill   Cholecalciferol (VITAMIN D3) 2000 UNITS TABS Take 2 tablets by mouth daily.     Cyanocobalamin (B-12) 1000 MCG CAPS      folic acid (FOLVITE) 400 MCG tablet Take 400 mcg by mouth daily.     levothyroxine  (SYNTHROID ) 88 MCG tablet TAKE 1 TAB BY MOUTH EVERY AM ON AN EMPTY STOMACH W/WATER ONLY. NO FOOD OR OTHER RXS FOR 30 MINUTES. 90 tablet 0   Multiple Vitamin (MULTI VITAMIN DAILY PO) Take 1 tablet by mouth daily.     Omega-3 Fatty Acids (FISH OIL) 1000 MG CAPS      Psyllium (METAMUCIL) 0.36 g CAPS      Zinc 100 MG TABS      No current facility-administered medications on file prior to visit.    BP 128/86   Pulse 60   Temp (!) 97.1 F (36.2 C) (Temporal)   Ht 5' 6 (1.676  m)   Wt 216 lb (98 kg)   SpO2 99%   BMI 34.86 kg/m  Objective:   Physical Exam HENT:     Right Ear: Tympanic membrane and ear canal normal.     Left Ear: Tympanic membrane and ear canal normal.  Eyes:     Pupils: Pupils are equal, round, and reactive to light.  Cardiovascular:     Rate and Rhythm: Normal rate and regular rhythm.  Pulmonary:     Effort: Pulmonary effort is normal.     Breath sounds: Normal breath sounds.  Abdominal:     General: Bowel sounds are normal.  Palpations: Abdomen is soft.     Tenderness: There is no abdominal tenderness.  Musculoskeletal:        General: Normal range of motion.     Cervical back: Neck supple.  Skin:    General: Skin is warm and dry.  Neurological:     Mental Status: She is alert and oriented to person, place, and time.     Cranial Nerves: No cranial nerve deficit.     Deep Tendon Reflexes:     Reflex Scores:      Patellar reflexes are 2+ on the right side and 2+ on the left side. Psychiatric:        Mood and Affect: Mood normal.     Physical Exam        Assessment & Plan:  Encounter for annual general medical examination with abnormal findings in adult Assessment & Plan:  First Shingrix  vaccine provided today. Declines influenza vaccine.  Pap smear UTD.  Mammogram due, orders placed. Colonoguard UTD, due 2026  Discussed the importance of a healthy diet and regular exercise in order for weight loss, and to reduce the risk of further co-morbidity.  Exam stable. Labs pending.  Follow up in 1 year for repeat physical.    Class 1 obesity due to excess calories without serious comorbidity with body mass index (BMI) of 34.0 to 34.9 in adult Assessment & Plan: Chronic history.   Commended her on a healthy lifestyle and exercise. Discussed options for treatment, she opts for GLP 1 agonist treatment.  Start semaglutide  (Wegovy ) for weight loss. Start by injecting 0.25 mg into the skin once weekly for 4 weeks,  then increase to 0.5 mg once weekly thereafter.   We discussed potential side effects and instructions for administration.  Will follow-up in 2 to 3 months once she has her medication.  Orders: -     Semaglutide -Weight Management; Inject 0.25 mg into the skin once a week.  Dispense: 2 mL; Refill: 0  Hyperlipidemia, unspecified hyperlipidemia type Assessment & Plan: Repeat lipid panel pending. Commended her on exercise and healthy lifestyle.  Orders: -     Semaglutide -Weight Management; Inject 0.25 mg into the skin once a week.  Dispense: 2 mL; Refill: 0 -     Lipid panel -     Hemoglobin A1c -     Comprehensive metabolic panel with GFR  Acquired hypothyroidism Assessment & Plan: She is taking levothyroxine  correctly.  Continue levothyroxine  88 mcg daily. Repeat TSH pending.   Screening mammogram for breast cancer -     3D Screening Mammogram, Left and Right; Future    Assessment and Plan Assessment & Plan      Comer MARLA Gaskins, NP   History of Present Illness

## 2024-04-18 ENCOUNTER — Other Ambulatory Visit: Payer: Self-pay | Admitting: Primary Care

## 2024-04-18 DIAGNOSIS — E66811 Obesity, class 1: Secondary | ICD-10-CM

## 2024-04-18 DIAGNOSIS — E785 Hyperlipidemia, unspecified: Secondary | ICD-10-CM

## 2024-04-19 NOTE — Telephone Encounter (Signed)
 Can we double check that her insurance denied the PA for Wegovy ?

## 2024-04-20 ENCOUNTER — Other Ambulatory Visit (HOSPITAL_COMMUNITY): Payer: Self-pay

## 2024-04-20 NOTE — Telephone Encounter (Signed)
 Noted. It looks like her BMI is just under the requirement for Wegovy  approval, according to her insurance. The required BMI is 35. We can always follow up in a few months as discussed to recheck her BMI.

## 2024-04-20 NOTE — Telephone Encounter (Signed)
 Called patient and reviewed all information. Patient verbalized understanding. Will call if any further questions.

## 2024-04-21 NOTE — Telephone Encounter (Signed)
 Pharmacy Patient Advocate Encounter  Received notification from [employer] that Prior Authorization for Wegovy  0.25MG /0.5ML auto-injectors  has been DENIED.  See denial reason below. No denial letter attached in CMM. Will attach denial letter to Media tab once received. PLEASE BE ADVISED PT was DENIED AND that the patient will need to join at [email or web address] and it looks like the order would need to come from one of their providers at [facility] PT WILL NEED TO GO ONLINE TO THE WEBSITE PA #/Case ID/Reference #: [id number]  Insufficient evidence of required CLINICAL CRITERIA INCLUDING HEART CONDITIONS, BMI >=35, documented lifestyle changes, and prior weight loss drug trial.

## 2024-04-22 NOTE — Telephone Encounter (Signed)
 Noted

## 2024-04-22 NOTE — Telephone Encounter (Signed)
 Can we apply for a Wegovy  appeal?  Her BMI is almost 35 and she has other medical issues such as prediabetes and hyperlipidemia.

## 2024-04-23 ENCOUNTER — Ambulatory Visit
Admission: RE | Admit: 2024-04-23 | Discharge: 2024-04-23 | Disposition: A | Discharge status: Home / Self Care | Source: Ambulatory Visit | Attending: Primary Care | Admitting: Primary Care

## 2024-04-23 DIAGNOSIS — Z1231 Encounter for screening mammogram for malignant neoplasm of breast: Secondary | ICD-10-CM | POA: Insufficient documentation

## 2024-04-30 NOTE — Telephone Encounter (Signed)
 error

## 2024-05-15 ENCOUNTER — Other Ambulatory Visit: Payer: Self-pay | Admitting: Primary Care

## 2024-05-15 DIAGNOSIS — E039 Hypothyroidism, unspecified: Secondary | ICD-10-CM

## 2024-07-16 ENCOUNTER — Encounter: Payer: Self-pay | Admitting: Primary Care

## 2024-07-16 ENCOUNTER — Ambulatory Visit: Payer: Self-pay | Admitting: Primary Care

## 2024-07-16 ENCOUNTER — Ambulatory Visit: Admitting: Primary Care

## 2024-07-16 VITALS — BP 110/62 | HR 67 | Temp 98.6°F | Ht 66.0 in | Wt 214.0 lb

## 2024-07-16 DIAGNOSIS — Z23 Encounter for immunization: Secondary | ICD-10-CM

## 2024-07-16 DIAGNOSIS — E039 Hypothyroidism, unspecified: Secondary | ICD-10-CM | POA: Diagnosis not present

## 2024-07-16 DIAGNOSIS — R7303 Prediabetes: Secondary | ICD-10-CM | POA: Insufficient documentation

## 2024-07-16 DIAGNOSIS — E785 Hyperlipidemia, unspecified: Secondary | ICD-10-CM | POA: Diagnosis not present

## 2024-07-16 LAB — LIPID PANEL
Cholesterol: 241 mg/dL — ABNORMAL HIGH (ref 0–200)
HDL: 49.4 mg/dL (ref >39.00)
LDL Cholesterol: 165 mg/dL — ABNORMAL HIGH (ref 0–99)
NonHDL: 191.11
Total CHOL/HDL Ratio: 5
Triglycerides: 131 mg/dL (ref 0.0–149.0)
VLDL: 26.2 mg/dL (ref 0.0–40.0)

## 2024-07-16 LAB — TSH: TSH: 1.41 u[IU]/mL (ref 0.35–5.50)

## 2024-07-16 LAB — HEMOGLOBIN A1C: Hgb A1c MFr Bld: 5.9 % (ref 4.6–6.5)

## 2024-07-16 NOTE — Addendum Note (Signed)
 Addended by: SEBASTIAN DANNA GRADE on: 07/16/2024 07:57 AM   Modules accepted: Orders

## 2024-07-16 NOTE — Assessment & Plan Note (Signed)
 Likely a genetic component based on last LDL.  Commended her on a healthy lifestyle. Repeat lipid panel pending

## 2024-07-16 NOTE — Assessment & Plan Note (Signed)
 She is taking levothyroxine correctly. Continue levothyroxine 88 mcg daily. Repeat TSH pending.

## 2024-07-16 NOTE — Assessment & Plan Note (Signed)
 Repeat A1C pending.  Commended her on a healthy lifestyle.

## 2024-07-16 NOTE — Progress Notes (Signed)
 Subjective:    Patient ID: Judith Oneal, female    DOB: 10/16/1975, 48 y.o.   MRN: [id number]  Judith Oneal is a very pleasant 48 y.o. female with a history of hypothyroidism, hyperlipidemia, prediabetes who presents today for follow-up of prediabetes and hyperlipidemia. She is also needing a TSH today. She is also due for her second Shingrix  vaccine.   She was last evaluated in September 2025 for her CPE, labs revealed A1C of 6.3 and LDL of 185. She does have a family history of heart disease on her mother's side, no heart attacks. She has a personal history of elevated LDL but this last test was higher than usual.   She leads a healthy lifestyle, is currently calorie counting at 1200 per day. She is exercising multiple days weekly. She feels well. She denies a known family history of diabetes.   She is taking levothyroxine  every morning on an empty stomach with water only.   No food or other medications for 30 minutes.   No heartburn medication, iron pills, calcium, vitamin D , or magnesium pills within four hours of taking levothyroxine .    BP Readings from Last 3 Encounters:  07/16/24 110/62  04/16/24 128/86  09/09/23 120/78     Wt Readings from Last 3 Encounters:  07/16/24 214 lb (97.1 kg)  04/16/24 216 lb (98 kg)  09/09/23 207 lb 6 oz (94.1 kg)      Review of Systems  Respiratory:  Negative for shortness of breath.   Cardiovascular:  Negative for chest pain.  Neurological:  Negative for numbness.         Past Medical History:  Diagnosis Date   Acute pain of left knee 03/28/2022   Cardiac arrhythmia due to congenital heart disease    Chest tightness 09/06/2021   Chicken pox    Chronic pain of left lower extremity 03/28/2021   Cystitis 09/06/2021   Dyspareunia in female    Heart murmur    Mitral valve prolapse   Hyperglycemia 04/02/2019   Hypertension    Thyroid  disease    Vitamin D  deficiency     Social History   Socioeconomic History   Marital  status: Divorced    Spouse name: Not on file   Number of children: Not on file   Years of education: Not on file   Highest education level: Associate degree: occupational, scientist, product/process development, or vocational program  Occupational History   Not on file  Tobacco Use   Smoking status: Never   Smokeless tobacco: Never  Vaping Use   Vaping status: Never Used  Substance and Sexual Activity   Alcohol use: Not Currently    Alcohol/week: 0.0 standard drinks of alcohol    Comment: Occasional use, socially    Drug use: No   Sexual activity: Not Currently    Birth control/protection: None  Other Topics Concern   Not on file  Social History Narrative   Single.   No children.   Works at [employer] as a production designer, theatre/television/film.   Enjoys going to the lake, reading.   Social Drivers of Health   Tobacco Use: Low Risk (07/16/2024)   Patient History    Smoking Tobacco Use: Never    Smokeless Tobacco Use: Never    Passive Exposure: Not on file  Financial Resource Strain: Medium Risk (07/14/2024)   Overall Financial Resource Strain (CARDIA)    Difficulty of Paying Living Expenses: Somewhat hard  Food Insecurity: No Food Insecurity (07/14/2024)   Epic  Worried About Programme Researcher, Broadcasting/film/video in the Last Year: Never true    Ran Out of Food in the Last Year: Never true  Transportation Needs: No Transportation Needs (07/14/2024)   Epic    Lack of Transportation (Medical): No    Lack of Transportation (Non-Medical): No  Physical Activity: Insufficiently Active (07/14/2024)   Exercise Vital Sign    Days of Exercise per Week: 3 days    Minutes of Exercise per Session: 40 min  Stress: No Stress Concern Present (07/14/2024)   [employer] of Occupational Health - Occupational Stress Questionnaire    Feeling of Stress: Not at all  Social Connections: Moderately Isolated (07/14/2024)   Social Connection and Isolation Panel    Frequency of Communication with Friends and Family: More than three times a week    Frequency of  Social Gatherings with Friends and Family: Once a week    Attends Religious Services: 1 to 4 times per year    Active Member of [employer] or Organizations: No    Attends Engineer, Structural: Not on file    Marital Status: Divorced  Intimate Partner Violence: Not on file  Depression (PHQ2-9): Low Risk (07/16/2024)   Depression (PHQ2-9)    PHQ-2 Score: 0  Alcohol Screen: Low Risk (07/14/2024)   Alcohol Screen    Last Alcohol Screening Score (AUDIT): 1  Housing: High Risk (07/14/2024)   Epic    Unable to Pay for Housing in the Last Year: Yes    Number of Times Moved in the Last Year: 0    Homeless in the Last Year: No  Utilities: Not on file  Health Literacy: Not on file    Past Surgical History:  Procedure Laterality Date   POLYPECTOMY  06/2014   polyp removal from uterus and mirena inserted    Family History  Problem Relation Age of Onset   Arthritis Mother    Sjogren's syndrome Mother    Prostate cancer Father    Lymphoma Father    Arthritis Maternal Grandfather    Hyperlipidemia Maternal Grandfather    Hypertension Maternal Grandfather    Kidney cancer Maternal Grandfather    Breast cancer Maternal Aunt    Breast cancer Other     Allergies[1]  Medications Ordered Prior to Encounter[2]  BP 110/62   Pulse 67   Temp 98.6 F (37 C) (Temporal)   Ht 5' 6 (1.676 m)   Wt 214 lb (97.1 kg)   SpO2 98%   BMI 34.54 kg/m  Objective:   Physical Exam Cardiovascular:     Rate and Rhythm: Normal rate and regular rhythm.  Pulmonary:     Effort: Pulmonary effort is normal.     Breath sounds: Normal breath sounds.  Musculoskeletal:     Cervical back: Neck supple.  Skin:    General: Skin is warm and dry.  Neurological:     Mental Status: She is alert and oriented to person, place, and time.  Psychiatric:        Mood and Affect: Mood normal.     Physical Exam        Assessment & Plan:  Prediabetes Assessment & Plan: Repeat A1C pending.  Commended her  on a healthy lifestyle.   Orders: -     Hemoglobin A1c  Hypothyroidism, unspecified type Assessment & Plan: She is taking levothyroxine  correctly.  Continue levothyroxine  88 mcg daily. Repeat TSH pending  Orders: -     TSH  Hyperlipidemia, unspecified hyperlipidemia type Assessment &  Plan: Likely a genetic component based on last LDL.  Commended her on a healthy lifestyle. Repeat lipid panel pending  Orders: -     Lipid panel    Assessment and Plan Assessment & Plan         Comer MARLA Gaskins, NP       [1] No Known Allergies [2]  Current Outpatient Medications on File Prior to Visit  Medication Sig Dispense Refill   Cholecalciferol (VITAMIN D3) 2000 UNITS TABS Take 2 tablets by mouth daily.     Cyanocobalamin (B-12) 1000 MCG CAPS      folic acid (FOLVITE) 400 MCG tablet Take 400 mcg by mouth daily.     levothyroxine  (SYNTHROID ) 88 MCG tablet TAKE 1 TAB BY MOUTH EVERY AM ON AN EMPTY STOMACH W/WATER ONLY. NO FOOD OR OTHER RXS FOR 30 MINUTES. 90 tablet 0   Multiple Vitamin (MULTI VITAMIN DAILY PO) Take 1 tablet by mouth daily.     Omega-3 Fatty Acids (FISH OIL) 1000 MG CAPS      Psyllium (METAMUCIL) 0.36 g CAPS      Zinc 100 MG TABS      No current facility-administered medications on file prior to visit.

## 2024-08-09 ENCOUNTER — Other Ambulatory Visit: Payer: Self-pay | Admitting: Primary Care

## 2024-08-09 DIAGNOSIS — E039 Hypothyroidism, unspecified: Secondary | ICD-10-CM
